# Patient Record
Sex: Female | Born: 1940 | Race: White | Hispanic: No | Marital: Married | Smoking: Never smoker
Health system: Southern US, Community
[De-identification: ages and names within clinical notes are randomized; demographics above are authoritative.]

## PROBLEM LIST (undated history)

## (undated) DIAGNOSIS — J869 Pyothorax without fistula: Secondary | ICD-10-CM

## (undated) DIAGNOSIS — Z9889 Other specified postprocedural states: Principal | ICD-10-CM

## (undated) DIAGNOSIS — J181 Lobar pneumonia, unspecified organism: Secondary | ICD-10-CM

## (undated) HISTORY — PX: CATARACT EXTRACTION: SUR2

## (undated) HISTORY — DX: Pyothorax without fistula: J86.9

## (undated) HISTORY — DX: Lobar pneumonia, unspecified organism: J18.1

## (undated) HISTORY — DX: Other specified postprocedural states: Z98.890

---

## 2001-08-26 ENCOUNTER — Other Ambulatory Visit: Admission: RE | Admit: 2001-08-26 | Discharge: 2001-08-26 | Payer: Self-pay | Admitting: Obstetrics and Gynecology

## 2002-01-20 ENCOUNTER — Encounter: Payer: Self-pay | Admitting: Obstetrics and Gynecology

## 2002-01-20 ENCOUNTER — Encounter: Admission: RE | Admit: 2002-01-20 | Discharge: 2002-01-20 | Payer: Self-pay | Admitting: Obstetrics and Gynecology

## 2018-10-02 ENCOUNTER — Emergency Department (HOSPITAL_COMMUNITY): Payer: Medicare Other

## 2018-10-02 ENCOUNTER — Inpatient Hospital Stay (HOSPITAL_COMMUNITY)
Admission: EM | Admit: 2018-10-02 | Discharge: 2018-10-08 | DRG: 853 | Disposition: A | Discharge status: Home Health | Payer: Medicare Other | Attending: Thoracic Surgery (Cardiothoracic Vascular Surgery) | Admitting: Thoracic Surgery (Cardiothoracic Vascular Surgery)

## 2018-10-02 ENCOUNTER — Encounter (HOSPITAL_COMMUNITY): Payer: Self-pay | Admitting: Emergency Medicine

## 2018-10-02 DIAGNOSIS — J189 Pneumonia, unspecified organism: Secondary | ICD-10-CM

## 2018-10-02 DIAGNOSIS — R0602 Shortness of breath: Secondary | ICD-10-CM

## 2018-10-02 DIAGNOSIS — Z79899 Other long term (current) drug therapy: Secondary | ICD-10-CM

## 2018-10-02 DIAGNOSIS — I5033 Acute on chronic diastolic (congestive) heart failure: Secondary | ICD-10-CM | POA: Diagnosis present

## 2018-10-02 DIAGNOSIS — D62 Acute posthemorrhagic anemia: Secondary | ICD-10-CM | POA: Diagnosis not present

## 2018-10-02 DIAGNOSIS — J181 Lobar pneumonia, unspecified organism: Secondary | ICD-10-CM | POA: Diagnosis present

## 2018-10-02 DIAGNOSIS — R911 Solitary pulmonary nodule: Secondary | ICD-10-CM | POA: Diagnosis present

## 2018-10-02 DIAGNOSIS — E876 Hypokalemia: Secondary | ICD-10-CM | POA: Diagnosis not present

## 2018-10-02 DIAGNOSIS — J869 Pyothorax without fistula: Secondary | ICD-10-CM | POA: Diagnosis present

## 2018-10-02 DIAGNOSIS — J90 Pleural effusion, not elsewhere classified: Secondary | ICD-10-CM | POA: Diagnosis present

## 2018-10-02 DIAGNOSIS — A419 Sepsis, unspecified organism: Secondary | ICD-10-CM | POA: Diagnosis not present

## 2018-10-02 DIAGNOSIS — J9621 Acute and chronic respiratory failure with hypoxia: Secondary | ICD-10-CM | POA: Diagnosis present

## 2018-10-02 DIAGNOSIS — Z9889 Other specified postprocedural states: Secondary | ICD-10-CM

## 2018-10-02 LAB — BASIC METABOLIC PANEL
Anion gap: 12 (ref 5–15)
BUN: 11 mg/dL (ref 8–23)
CO2: 23 mmol/L (ref 22–32)
Calcium: 9.1 mg/dL (ref 8.9–10.3)
Chloride: 105 mmol/L (ref 98–111)
Creatinine, Ser: 0.82 mg/dL (ref 0.44–1.00)
GFR calc Af Amer: >60 mL/min (ref >60)
GFR calc non Af Amer: >60 mL/min (ref >60)
Glucose, Bld: 175 mg/dL — ABNORMAL HIGH (ref 70–99)
Potassium: 3.7 mmol/L (ref 3.5–5.1)
Sodium: 140 mmol/L (ref 135–145)

## 2018-10-02 LAB — I-STAT TROPONIN, ED: Troponin i, poc: 0.03 ng/mL (ref 0.00–0.08)

## 2018-10-02 LAB — CBC
HCT: 34.9 % — ABNORMAL LOW (ref 36.0–46.0)
Hemoglobin: 11.2 g/dL — ABNORMAL LOW (ref 12.0–15.0)
MCH: 30.7 pg (ref 26.0–34.0)
MCHC: 32.1 g/dL (ref 30.0–36.0)
MCV: 95.6 fL (ref 80.0–100.0)
Platelets: 348 10*3/uL (ref 150–400)
RBC: 3.65 MIL/uL — ABNORMAL LOW (ref 3.87–5.11)
RDW: 14 % (ref 11.5–15.5)
WBC: 22.1 10*3/uL — ABNORMAL HIGH (ref 4.0–10.5)
nRBC: 0 % (ref 0.0–0.2)

## 2018-10-02 MED ORDER — HYDROMORPHONE HCL 1 MG/ML IJ SOLN
0.5000 mg | Freq: Once | INTRAMUSCULAR | Status: AC
  Administered 2018-10-02: 0.5 mg via INTRAVENOUS
  Filled 2018-10-02: qty 1

## 2018-10-02 MED ORDER — SODIUM CHLORIDE 0.9 % IV SOLN
2.0000 g | Freq: Once | INTRAVENOUS | Status: AC
  Administered 2018-10-02: 2 g via INTRAVENOUS
  Filled 2018-10-02: qty 20

## 2018-10-02 MED ORDER — KETOROLAC TROMETHAMINE 15 MG/ML IJ SOLN
15.0000 mg | Freq: Once | INTRAMUSCULAR | Status: AC
  Administered 2018-10-02: 15 mg via INTRAVENOUS
  Filled 2018-10-02: qty 1

## 2018-10-02 MED ORDER — ACETAMINOPHEN 325 MG PO TABS
650.0000 mg | ORAL_TABLET | Freq: Once | ORAL | Status: AC
  Administered 2018-10-02: 650 mg via ORAL
  Filled 2018-10-02: qty 2

## 2018-10-02 MED ORDER — SODIUM CHLORIDE 0.9 % IV SOLN
500.0000 mg | Freq: Once | INTRAVENOUS | Status: AC
  Administered 2018-10-02: 500 mg via INTRAVENOUS
  Filled 2018-10-02: qty 500

## 2018-10-02 MED ORDER — SODIUM CHLORIDE 0.9 % IV BOLUS
1000.0000 mL | Freq: Once | INTRAVENOUS | Status: AC
  Administered 2018-10-02: 1000 mL via INTRAVENOUS

## 2018-10-02 NOTE — ED Provider Notes (Signed)
[facility] EMERGENCY DEPARTMENT Provider Note   CSN: [id number] Arrival date & time: 10/02/18  1726     History   Chief Complaint Chief Complaint  Patient presents with  . Chest Pain    HPI Katie Schroeder is a 78 y.o. female.  HPI   78 year old female with chest pain and cough.  She reports chills this past weekend, congestion and cough.  Chills resolved and cough seemed to improve on Monday and Tuesday.  Worsening symptoms again yesterday.  Today she began developing very sharp left-sided chest pain with radiation to her back.  Worse with laying on her back, certain movements, deep breathing and coughing. No unusual leg pain or swelling.  Never smoked.  History reviewed. No pertinent past medical history.  There are no active problems to display for this patient.   Past Surgical History:  Procedure Laterality Date  . CESAREAN SECTION       OB History   No obstetric history on file.      Home Medications    Prior to Admission medications   Not on File    Family History History reviewed. No pertinent family history.  Social History Social History   Tobacco Use  . Smoking status: Never Smoker  . Smokeless tobacco: Never Used  Substance Use Topics  . Alcohol use: Not Currently  . Drug use: Never     Allergies   Patient has no known allergies.   Review of Systems Review of Systems  All systems reviewed and negative, other than as noted in HPI.  Physical Exam Updated Vital Signs BP (!) 145/62 (BP Location: Right Arm)   Pulse (!) 124   Temp 99.9 F (37.7 C) (Oral)   Resp 19   Ht 5\' 1"  (1.549 m)   Wt 64 kg   SpO2 100%   BMI 26.64 kg/m   Physical Exam Vitals signs and nursing note reviewed.  Constitutional:      General: She is not in acute distress.    Appearance: She is well-developed.  HENT:     Head: Normocephalic and atraumatic.  Eyes:     General:        Right eye: No discharge.        Left eye: No  discharge.     Conjunctiva/sclera: Conjunctivae normal.  Neck:     Musculoskeletal: Neck supple.  Cardiovascular:     Rate and Rhythm: Regular rhythm. Tachycardia present.     Heart sounds: Normal heart sounds. No murmur. No friction rub. No gallop.   Pulmonary:     Comments: Left-sided rhonchi Abdominal:     General: There is no distension.     Palpations: Abdomen is soft.     Tenderness: There is no abdominal tenderness.  Musculoskeletal:        General: No tenderness.     Comments: Lower extremities symmetric as compared to each other. No calf tenderness. Negative Homan's. No palpable cords.   Skin:    General: Skin is warm and dry.  Neurological:     Mental Status: She is alert.  Psychiatric:        Behavior: Behavior normal.        Thought Content: Thought content normal.      ED Treatments / Results  Labs (all labs ordered are listed, but only abnormal results are displayed) Labs Reviewed  BASIC METABOLIC PANEL - Abnormal; Notable for the following components:      Result Value   Glucose,  Bld 175 (*)    All other components within normal limits  CBC - Abnormal; Notable for the following components:   WBC 22.1 (*)    RBC 3.65 (*)    Hemoglobin 11.2 (*)    HCT 34.9 (*)    All other components within normal limits  I-STAT TROPONIN, ED    EKG None  Radiology Dg Chest 2 View  Result Date: 10/02/2018 CLINICAL DATA:  Left-sided chest pain EXAM: CHEST - 2 VIEW COMPARISON:  None. FINDINGS: Small moderate left pleural effusion, possibly slightly loculated along the lateral chest. Consolidation at the lingula and left lung base. Mild cardiomegaly. No pneumothorax. Degenerative changes of the spine. IMPRESSION: Small moderate left pleural effusion with airspace disease at the lingula and left lung base, possible pneumonia. Electronically Signed   By: Jasmine PangKim  Fujinaga M.D.   On: 10/02/2018 19:45    Procedures Procedures (including critical care time)  Medications Ordered  in ED Medications - No data to display   Initial Impression / Assessment and Plan / ED Course  I have reviewed the triage vital signs and the nursing notes.  Pertinent labs & imaging results that were available during my care of the patient were reviewed by me and considered in my medical decision making (see chart for details).     78 year old female with chest pain, cough and recent fevers.  Clinically pneumonia.  Significant leukocytosis.  Her area of pain corresponds abnormality on imaging.  She is otherwise healthy.  No known underlying medical illnesses.  She is on any chronic medications.  Aside from leukocytosis, her labs are reassuring.  She is not hypoxic.  Admission offered but she would prefer to go home. I think this is probably fine. She wa given a dose of Rocephin and azithromycin here in the emergency room.  Plan continued outpatient antibiotics.  12:53 AM Initial plan for discharge. While remaining in the ED to get dose of abx, she actually developed mild hypoxemia. RR remains pretty elevated. BP borderline. I think would be more prudent to admit.   Final Clinical Impressions(s) / ED Diagnoses   Final diagnoses:  Community acquired pneumonia of left lung, unspecified part of lung    ED Discharge Orders    None       Raeford RazorKohut, Defne Gerling, MD 10/03/18 [phone]

## 2018-10-02 NOTE — ED Notes (Signed)
Pt complained that she was feeling warm and was wondering if she was running a temperature. Temp was 99.9.

## 2018-10-02 NOTE — ED Triage Notes (Addendum)
Pt states she has had nasal congestion for a week, this is resolving. Pt developed left sided cp last night and continued into today. Pt went to primary MD and was sent here for further evaluation. Possible pna? Denies SOB, n/v.

## 2018-10-03 ENCOUNTER — Observation Stay (HOSPITAL_COMMUNITY): Payer: Medicare Other

## 2018-10-03 ENCOUNTER — Encounter (HOSPITAL_COMMUNITY): Payer: Self-pay | Admitting: Internal Medicine

## 2018-10-03 ENCOUNTER — Inpatient Hospital Stay (HOSPITAL_COMMUNITY): Payer: Medicare Other

## 2018-10-03 ENCOUNTER — Other Ambulatory Visit: Payer: Self-pay

## 2018-10-03 DIAGNOSIS — A419 Sepsis, unspecified organism: Secondary | ICD-10-CM | POA: Diagnosis present

## 2018-10-03 DIAGNOSIS — J9621 Acute and chronic respiratory failure with hypoxia: Secondary | ICD-10-CM | POA: Diagnosis present

## 2018-10-03 DIAGNOSIS — I5033 Acute on chronic diastolic (congestive) heart failure: Secondary | ICD-10-CM | POA: Diagnosis present

## 2018-10-03 DIAGNOSIS — J181 Lobar pneumonia, unspecified organism: Secondary | ICD-10-CM | POA: Diagnosis present

## 2018-10-03 DIAGNOSIS — I370 Nonrheumatic pulmonary valve stenosis: Secondary | ICD-10-CM | POA: Diagnosis not present

## 2018-10-03 DIAGNOSIS — E876 Hypokalemia: Secondary | ICD-10-CM | POA: Diagnosis not present

## 2018-10-03 DIAGNOSIS — D62 Acute posthemorrhagic anemia: Secondary | ICD-10-CM | POA: Diagnosis not present

## 2018-10-03 DIAGNOSIS — R911 Solitary pulmonary nodule: Secondary | ICD-10-CM | POA: Diagnosis present

## 2018-10-03 DIAGNOSIS — Z79899 Other long term (current) drug therapy: Secondary | ICD-10-CM | POA: Diagnosis not present

## 2018-10-03 DIAGNOSIS — I361 Nonrheumatic tricuspid (valve) insufficiency: Secondary | ICD-10-CM | POA: Diagnosis not present

## 2018-10-03 DIAGNOSIS — J90 Pleural effusion, not elsewhere classified: Secondary | ICD-10-CM | POA: Diagnosis present

## 2018-10-03 DIAGNOSIS — J869 Pyothorax without fistula: Secondary | ICD-10-CM | POA: Diagnosis present

## 2018-10-03 DIAGNOSIS — J189 Pneumonia, unspecified organism: Secondary | ICD-10-CM | POA: Diagnosis present

## 2018-10-03 HISTORY — DX: Lobar pneumonia, unspecified organism: J18.1

## 2018-10-03 LAB — CBC
HCT: 28.5 % — ABNORMAL LOW (ref 36.0–46.0)
HCT: 28.2 % — ABNORMAL LOW (ref 36.0–46.0)
Hemoglobin: 9.2 g/dL — ABNORMAL LOW (ref 12.0–15.0)
Hemoglobin: 9.4 g/dL — ABNORMAL LOW (ref 12.0–15.0)
MCH: 31 pg (ref 26.0–34.0)
MCH: 31.2 pg (ref 26.0–34.0)
MCHC: 32.3 g/dL (ref 30.0–36.0)
MCHC: 33.3 g/dL (ref 30.0–36.0)
MCV: 96 fL (ref 80.0–100.0)
MCV: 93.7 fL (ref 80.0–100.0)
NRBC: 0 % (ref 0.0–0.2)
Platelets: 288 10*3/uL (ref 150–400)
Platelets: 335 10*3/uL (ref 150–400)
RBC: 2.97 MIL/uL — ABNORMAL LOW (ref 3.87–5.11)
RBC: 3.01 MIL/uL — ABNORMAL LOW (ref 3.87–5.11)
RDW: 14.2 % (ref 11.5–15.5)
RDW: 14.2 % (ref 11.5–15.5)
WBC: 18.9 10*3/uL — AB (ref 4.0–10.5)
WBC: 21.4 10*3/uL — ABNORMAL HIGH (ref 4.0–10.5)
nRBC: 0 % (ref 0.0–0.2)

## 2018-10-03 LAB — BASIC METABOLIC PANEL
ANION GAP: 9 (ref 5–15)
BUN: 10 mg/dL (ref 8–23)
CO2: 21 mmol/L — ABNORMAL LOW (ref 22–32)
Calcium: 7.6 mg/dL — ABNORMAL LOW (ref 8.9–10.3)
Chloride: 111 mmol/L (ref 98–111)
Creatinine, Ser: 0.73 mg/dL (ref 0.44–1.00)
GFR calc Af Amer: >60 mL/min (ref >60)
GFR calc non Af Amer: >60 mL/min (ref >60)
Glucose, Bld: 115 mg/dL — ABNORMAL HIGH (ref 70–99)
Potassium: 3.5 mmol/L (ref 3.5–5.1)
SODIUM: 141 mmol/L (ref 135–145)

## 2018-10-03 LAB — ECHOCARDIOGRAM COMPLETE
HEIGHTINCHES: 61 in
Weight: 2340.8 oz

## 2018-10-03 LAB — PROTIME-INR
INR: 1.12
Prothrombin Time: 14.3 seconds (ref 11.4–15.2)

## 2018-10-03 LAB — SURGICAL PCR SCREEN
MRSA, PCR: NEGATIVE
Staphylococcus aureus: NEGATIVE

## 2018-10-03 LAB — HEPATIC FUNCTION PANEL
ALBUMIN: 2.5 g/dL — AB (ref 3.5–5.0)
ALT: 45 U/L — ABNORMAL HIGH (ref 0–44)
AST: 36 U/L (ref 15–41)
Alkaline Phosphatase: 128 U/L — ABNORMAL HIGH (ref 38–126)
Bilirubin, Direct: 0.2 mg/dL (ref 0.0–0.2)
Indirect Bilirubin: 0.5 mg/dL (ref 0.3–0.9)
Total Bilirubin: 0.7 mg/dL (ref 0.3–1.2)
Total Protein: 6.2 g/dL — ABNORMAL LOW (ref 6.5–8.1)

## 2018-10-03 LAB — COMPREHENSIVE METABOLIC PANEL
ALT: 41 U/L (ref 0–44)
AST: 29 U/L (ref 15–41)
Albumin: 2.3 g/dL — ABNORMAL LOW (ref 3.5–5.0)
Alkaline Phosphatase: 113 U/L (ref 38–126)
Anion gap: 10 (ref 5–15)
BUN: 10 mg/dL (ref 8–23)
CHLORIDE: 106 mmol/L (ref 98–111)
CO2: 24 mmol/L (ref 22–32)
CREATININE: 0.68 mg/dL (ref 0.44–1.00)
Calcium: 8.3 mg/dL — ABNORMAL LOW (ref 8.9–10.3)
GFR calc Af Amer: >60 mL/min (ref >60)
Glucose, Bld: 119 mg/dL — ABNORMAL HIGH (ref 70–99)
Potassium: 3.3 mmol/L — ABNORMAL LOW (ref 3.5–5.1)
Sodium: 140 mmol/L (ref 135–145)
Total Bilirubin: 0.8 mg/dL (ref 0.3–1.2)
Total Protein: 5.5 g/dL — ABNORMAL LOW (ref 6.5–8.1)

## 2018-10-03 LAB — TYPE AND SCREEN
ABO/RH(D): A NEG
Antibody Screen: NEGATIVE

## 2018-10-03 LAB — GLUCOSE, CAPILLARY: Glucose-Capillary: 112 mg/dL — ABNORMAL HIGH (ref 70–99)

## 2018-10-03 LAB — LACTIC ACID, PLASMA
Lactic Acid, Venous: 0.8 mmol/L (ref 0.5–1.9)
Lactic Acid, Venous: 0.9 mmol/L (ref 0.5–1.9)

## 2018-10-03 LAB — BRAIN NATRIURETIC PEPTIDE: B Natriuretic Peptide: 328.4 pg/mL — ABNORMAL HIGH (ref 0.0–100.0)

## 2018-10-03 LAB — APTT: aPTT: 41 seconds — ABNORMAL HIGH (ref 24–36)

## 2018-10-03 LAB — PROCALCITONIN: Procalcitonin: 0.48 ng/mL

## 2018-10-03 LAB — STREP PNEUMONIAE URINARY ANTIGEN: Strep Pneumo Urinary Antigen: NEGATIVE

## 2018-10-03 LAB — ABO/RH: ABO/RH(D): A NEG

## 2018-10-03 LAB — INFLUENZA PANEL BY PCR (TYPE A & B)
Influenza A By PCR: NEGATIVE
Influenza B By PCR: NEGATIVE

## 2018-10-03 LAB — LACTATE DEHYDROGENASE: LDH: 194 U/L — ABNORMAL HIGH (ref 98–192)

## 2018-10-03 MED ORDER — LEVALBUTEROL HCL 1.25 MG/0.5ML IN NEBU: 1.2500 mg | INHALATION_SOLUTION | Freq: Four times a day (QID) | RESPIRATORY_TRACT | Status: DC | PRN

## 2018-10-03 MED ORDER — IPRATROPIUM-ALBUTEROL 0.5-2.5 (3) MG/3ML IN SOLN
3.0000 mL | Freq: Four times a day (QID) | RESPIRATORY_TRACT | Status: DC
  Administered 2018-10-03: 3 mL via RESPIRATORY_TRACT
  Filled 2018-10-03: qty 3

## 2018-10-03 MED ORDER — SODIUM CHLORIDE 0.9 % IV BOLUS
1000.0000 mL | Freq: Once | INTRAVENOUS | Status: AC
  Administered 2018-10-03: 1000 mL via INTRAVENOUS

## 2018-10-03 MED ORDER — ZOLPIDEM TARTRATE 5 MG PO TABS: 5.0000 mg | ORAL_TABLET | Freq: Every evening | ORAL | Status: DC | PRN

## 2018-10-03 MED ORDER — ACETAMINOPHEN 325 MG PO TABS
650.0000 mg | ORAL_TABLET | Freq: Four times a day (QID) | ORAL | Status: DC | PRN
  Administered 2018-10-03: 650 mg via ORAL
  Filled 2018-10-03: qty 2

## 2018-10-03 MED ORDER — ENOXAPARIN SODIUM 40 MG/0.4ML ~~LOC~~ SOLN
40.0000 mg | SUBCUTANEOUS | Status: DC
  Administered 2018-10-03: 40 mg via SUBCUTANEOUS
  Filled 2018-10-03: qty 0.4

## 2018-10-03 MED ORDER — AMOXICILLIN 500 MG PO CAPS: 1000.0000 mg | ORAL_CAPSULE | Freq: Three times a day (TID) | ORAL | 0 refills | Status: DC

## 2018-10-03 MED ORDER — IOHEXOL 300 MG/ML  SOLN
75.0000 mL | Freq: Once | INTRAMUSCULAR | Status: AC | PRN
  Administered 2018-10-03: 75 mL via INTRAVENOUS

## 2018-10-03 MED ORDER — LEVALBUTEROL HCL 1.25 MG/0.5ML IN NEBU
INHALATION_SOLUTION | RESPIRATORY_TRACT | Status: AC
  Filled 2018-10-03: qty 0.5

## 2018-10-03 MED ORDER — SODIUM CHLORIDE 0.9 % IV SOLN
INTRAVENOUS | Status: DC
  Administered 2018-10-03 (×2): via INTRAVENOUS

## 2018-10-03 MED ORDER — GUAIFENESIN-CODEINE 100-10 MG/5ML PO SYRP: 5.0000 mL | ORAL_SOLUTION | Freq: Three times a day (TID) | ORAL | 0 refills | Status: DC | PRN

## 2018-10-03 MED ORDER — LEVALBUTEROL HCL 1.25 MG/0.5ML IN NEBU: 1.2500 mg | INHALATION_SOLUTION | Freq: Four times a day (QID) | RESPIRATORY_TRACT | Status: DC

## 2018-10-03 MED ORDER — POTASSIUM CHLORIDE CRYS ER 20 MEQ PO TBCR
40.0000 meq | EXTENDED_RELEASE_TABLET | Freq: Once | ORAL | Status: AC
  Administered 2018-10-03: 40 meq via ORAL
  Filled 2018-10-03: qty 2

## 2018-10-03 MED ORDER — LEVALBUTEROL HCL 1.25 MG/0.5ML IN NEBU
1.2500 mg | INHALATION_SOLUTION | Freq: Four times a day (QID) | RESPIRATORY_TRACT | Status: DC | PRN
  Administered 2018-10-03: 1.25 mg via RESPIRATORY_TRACT
  Filled 2018-10-03: qty 0.5

## 2018-10-03 MED ORDER — HYDROMORPHONE HCL 1 MG/ML IJ SOLN
0.5000 mg | Freq: Once | INTRAMUSCULAR | Status: AC
  Administered 2018-10-03: 0.5 mg via INTRAVENOUS
  Filled 2018-10-03: qty 1

## 2018-10-03 MED ORDER — MELOXICAM 7.5 MG PO TABS: 15.0000 mg | ORAL_TABLET | Freq: Every day | ORAL | 0 refills | Status: DC | PRN

## 2018-10-03 MED ORDER — VANCOMYCIN HCL IN DEXTROSE 1-5 GM/200ML-% IV SOLN
1000.0000 mg | INTRAVENOUS | Status: AC
  Administered 2018-10-04: 1000 mg via INTRAVENOUS
  Filled 2018-10-03 (×2): qty 200

## 2018-10-03 MED ORDER — AZITHROMYCIN 250 MG PO TABS: 250.0000 mg | ORAL_TABLET | Freq: Every day | ORAL | 0 refills | Status: DC

## 2018-10-03 MED ORDER — SODIUM CHLORIDE 0.9 % IV SOLN
1.0000 g | INTRAVENOUS | Status: DC
  Administered 2018-10-03 – 2018-10-05 (×3): 1 g via INTRAVENOUS
  Filled 2018-10-03 (×3): qty 10

## 2018-10-03 MED ORDER — SODIUM CHLORIDE 0.9 % IV SOLN
500.0000 mg | INTRAVENOUS | Status: DC
  Administered 2018-10-04 (×2): 500 mg via INTRAVENOUS
  Filled 2018-10-03 (×2): qty 500

## 2018-10-03 MED ORDER — FUROSEMIDE 10 MG/ML IJ SOLN
60.0000 mg | Freq: Once | INTRAMUSCULAR | Status: AC
  Administered 2018-10-03: 60 mg via INTRAVENOUS
  Filled 2018-10-03: qty 6

## 2018-10-03 MED ORDER — ONDANSETRON HCL 4 MG/2ML IJ SOLN: 4.0000 mg | Freq: Three times a day (TID) | INTRAMUSCULAR | Status: DC | PRN

## 2018-10-03 MED ORDER — DM-GUAIFENESIN ER 30-600 MG PO TB12: 1.0000 | ORAL_TABLET | Freq: Two times a day (BID) | ORAL | Status: DC | PRN

## 2018-10-03 MED ORDER — SODIUM CHLORIDE 0.9 % IV SOLN: 500.0000 mg | INTRAVENOUS | Status: DC

## 2018-10-03 NOTE — Progress Notes (Signed)
PROGRESS NOTE                                                                                                                                                                                                             Patient Demographics:    Katie Schroeder, is a 78 y.o. female, DOB - 1941/01/06, RVI:[id number]  Admit date - 10/02/2018   Admitting Physician Lorretta Harp, MD  Outpatient Primary MD for the patient is Patient, No Pcp Per  LOS - 0  Chief Complaint  Patient presents with  . Chest Pain       Brief Narrative [name] is a 78 y.o. female without significant past medical history, not currently taking any medications, who presents with cough, chest pain, fever and chills, he was admitted with  Acute hypoxic respiratory failure, sepsis, pneumonia and loculated left-sided pleural effusion.   Subjective:    Katie Schroeder today has, No headache, No chest pain, No abdominal pain - No Nausea, No new weakness tingling or numbness, mild Cough with ++ SOB.     Assessment  & Plan :      1.  Acute hypoxic respiratory failure, sepsis, pneumonia and loculated left-sided pleural effusion.  Continue antibiotics, follow cultures, sepsis pathophysiology seems to have improved, discussed CT findings with pulmonary on call Dr. Daleen Bo who recommended to tap the fluid for testing and to consult cardiothoracic surgery as patient may require decortication.  Cardiothoracic surgery has been consulted and IR has been consulted for the tap as well.  Appropriate tests have been ordered.  Continue supportive care with oxygen and nebulizer treatments for now.  2.  Acute on chronic diastolic CHF.  Diffuse Rales bilaterally.  Hold further IV fluids, Lasix IV, get echocardiogram and BNP.  Requested to sit up in chair as much as possible.      Family Communication  :  none  Code Status :  Full  Disposition Plan  :  Tele  Consults  :  IR, cardiothoracic surgery, pulmonary over the  phone  Procedures  :    TTE -   CT  -  Consolidation in the left lower lobe as well as loculated left pleural effusion which appears worsened when compared with the prior plain film of 10/02/2018. Somewhat spiculated lesion in the right upper lobe measuring 2 cm in greatest dimension. Although this may be postinflammatory in nature and related to the changes on the left side, follow-up CT examination in 3 months is recommended. Aortic  Atherosclerosis (ICD10-I70.0).  DVT Prophylaxis  :  Lovenox   Lab Results  Component Value Date   PLT 288 10/03/2018    Diet :  Diet Order            Diet regular Room service appropriate? Yes; Fluid consistency: Thin  Diet effective now               Inpatient Medications Scheduled Meds: . enoxaparin (LOVENOX) injection  40 mg Subcutaneous Q24H   Continuous Infusions: . azithromycin    . cefTRIAXone (ROCEPHIN)  IV     PRN Meds:.acetaminophen, dextromethorphan-guaiFENesin, levalbuterol, ondansetron (ZOFRAN) IV, zolpidem  Antibiotics  :   Anti-infectives (From admission, onward)   Start     Dose/Rate Route Frequency Ordered Stop   10/04/18 0000  azithromycin (ZITHROMAX) 500 mg in sodium chloride 0.9 % 250 mL IVPB  Status:  Discontinued     500 mg 250 mL/hr over 60 Minutes Intravenous Every 24 hours 10/03/18 0109 10/03/18 0425   10/03/18 2200  cefTRIAXone (ROCEPHIN) 1 g in sodium chloride 0.9 % 100 mL IVPB     1 g 200 mL/hr over 30 Minutes Intravenous Every 24 hours 10/03/18 0109 10/10/18 2159   10/03/18 2200  azithromycin (ZITHROMAX) 500 mg in sodium chloride 0.9 % 250 mL IVPB     500 mg 250 mL/hr over 60 Minutes Intravenous Every 24 hours 10/03/18 0425 10/10/18 2159   10/03/18 0000  amoxicillin (AMOXIL) 500 MG capsule     1,000 mg Oral 3 times daily 10/03/18 0025     10/03/18 0000  azithromycin (ZITHROMAX) 250 MG tablet     250 mg Oral Daily 10/03/18 0025     10/02/18 2230  cefTRIAXone (ROCEPHIN) 2 g in sodium chloride 0.9 % 100 mL  IVPB     2 g 200 mL/hr over 30 Minutes Intravenous  Once 10/02/18 2221 10/02/18 2339   10/02/18 2230  azithromycin (ZITHROMAX) 500 mg in sodium chloride 0.9 % 250 mL IVPB     500 mg 250 mL/hr over 60 Minutes Intravenous  Once 10/02/18 2221 10/03/18 0045          Objective:   Vitals:   10/03/18 0531 10/03/18 0920 10/03/18 0923 10/03/18 0935  BP: (!) 107/55   132/66  Pulse: 97 (!) 137 (!) 106 100  Resp: 18   18  Temp: 97.7 F (36.5 C)   99.1 F (37.3 C)  TempSrc: Oral   Oral  SpO2: 96% (!) 79% 93% 96%  Weight:      Height:        Wt Readings from Last 3 Encounters:  10/03/18 66.4 kg     Intake/Output Summary (Last 24 hours) at 10/03/2018 1209 Last data filed at 10/03/2018 1203 Gross per 24 hour  Intake 5898.24 ml  Output 1001 ml  Net 4897.24 ml     Physical Exam  Awake Alert, Oriented X 3, No new F.N deficits, Normal affect Pratt.AT,PERRAL Supple Neck,No JVD, No cervical lymphadenopathy appriciated.  Symmetrical Chest wall movement, Good air movement bilaterally, diffuse rales RRR,No Gallops,Rubs or new Murmurs, No Parasternal Heave +ve B.Sounds, Abd Soft, No tenderness, No organomegaly appriciated, No rebound - guarding or rigidity. No Cyanosis, Clubbing or edema, No new Rash or bruise      Data Review:    CBC Recent Labs  Lab 10/02/18 1746 10/03/18 0735  WBC 22.1* 18.9*  HGB 11.2* 9.2*  HCT 34.9* 28.5*  PLT 348 288  MCV 95.6 96.0  MCH 30.7 31.0  MCHC 32.1 32.3  RDW 14.0 14.2    Chemistries  Recent Labs  Lab 10/02/18 1746 10/03/18 0735  NA 140 141  K 3.7 3.5  CL 105 111  CO2 23 21*  GLUCOSE 175* 115*  BUN 11 10  CREATININE 0.82 0.73  CALCIUM 9.1 7.6*   ------------------------------------------------------------------------------------------------------------------ No results for input(s): CHOL, HDL, LDLCALC, TRIG, CHOLHDL, LDLDIRECT in the last 72 hours.  No results found for:  HGBA1C ------------------------------------------------------------------------------------------------------------------ No results for input(s): TSH, T4TOTAL, T3FREE, THYROIDAB in the last 72 hours.  Invalid input(s): FREET3 ------------------------------------------------------------------------------------------------------------------ No results for input(s): VITAMINB12, FOLATE, FERRITIN, TIBC, IRON, RETICCTPCT in the last 72 hours.  Coagulation profile No results for input(s): INR, PROTIME in the last 168 hours.  No results for input(s): DDIMER in the last 72 hours.  Cardiac Enzymes No results for input(s): CKMB, TROPONINI, MYOGLOBIN in the last 168 hours.  Invalid input(s): CK ------------------------------------------------------------------------------------------------------------------ No results found for: BNP  Micro Results No results found for this or any previous visit (from the past 240 hour(s)).  Radiology Reports Dg Chest 2 View  Result Date: 10/02/2018 CLINICAL DATA:  Left-sided chest pain EXAM: CHEST - 2 VIEW COMPARISON:  None. FINDINGS: Small moderate left pleural effusion, possibly slightly loculated along the lateral chest. Consolidation at the lingula and left lung base. Mild cardiomegaly. No pneumothorax. Degenerative changes of the spine. IMPRESSION: Small moderate left pleural effusion with airspace disease at the lingula and left lung base, possible pneumonia. Electronically Signed   By: Jasmine PangKim  Fujinaga M.D.   On: 10/02/2018 19:45   Ct Chest W Contrast  Result Date: 10/03/2018 CLINICAL DATA:  Increasing shortness of breath EXAM: CT CHEST WITH CONTRAST TECHNIQUE: Multidetector CT imaging of the chest was performed during intravenous contrast administration. CONTRAST:  75mL OMNIPAQUE IOHEXOL 300 MG/ML  SOLN COMPARISON:  Plain film from earlier in the same day. FINDINGS: Cardiovascular: Thoracic aorta demonstrates atherosclerotic calcifications without aneurysmal  dilatation or dissection. Heart is at the upper limits of normal in size. Coronary calcifications are noted. No large central pulmonary embolus is seen. Peripheral branch evaluation is limited. Mediastinum/Nodes: Thoracic inlet is within normal limits. Scattered small mediastinal lymph nodes are noted likely reactive in nature. No sizable adenopathy is seen. Lungs/Pleura: Small right pleural effusion is noted. More sizable left pleural effusion is noted with areas of loculation along the chest wall particularly laterally and near the apex. Right lung is well aerated. A somewhat spiculated density is noted in the right upper lobe best seen on image number 57 of series 7. This measures approximately 2 cm in greatest dimension consolidation is noted within the left lower lobe. No sizable nodule on the left is seen. Upper Abdomen: No acute abnormality. Musculoskeletal: Degenerative changes of the thoracic spine are noted. IMPRESSION: Consolidation in the left lower lobe as well as loculated left pleural effusion which appears worsened when compared with the prior plain film of 10/02/2018. Somewhat spiculated lesion in the right upper lobe measuring 2 cm in greatest dimension. Although this may be postinflammatory in nature and related to the changes on the left side, follow-up CT examination in 3 months is recommended. Aortic Atherosclerosis (ICD10-I70.0). Electronically Signed   By: Alcide CleverMark  Lukens M.D.   On: 10/03/2018 11:08   Dg Chest Port 1 View  Result Date: 10/03/2018 CLINICAL DATA:  Short of breath EXAM: PORTABLE CHEST 1 VIEW COMPARISON:  10/02/2018 FINDINGS: The heart remains moderately enlarged. Left pleural effusion which is likely partially loculated has increased. Opacity at the left base has  also increased likely combination of pleural fluid and consolidation. Small right pleural effusion has developed. No pneumothorax. IMPRESSION: Increasing left pleural effusion which appears partially loculated.  Increasing opacity at the left base. New small right pleural effusion. Electronically Signed   By: Jolaine ClickArthur  Hoss M.D.   On: 10/03/2018 09:15    Time Spent in minutes  30   Susa RaringPrashant Wael Maestas M.D on 10/03/2018 at 12:09 PM  To page go to [email or web address] - password [email or web address]

## 2018-10-03 NOTE — Progress Notes (Signed)
  Echocardiogram 2D Echocardiogram has been performed.  Janalyn Harder 10/03/2018, 11:56 AM

## 2018-10-03 NOTE — H&P (View-Only) (Signed)
Reason for Consult:Left parapneumonic effusion Referring Physician: [facility]  Katie Schroeder is an 78 y.o. female.  HPI: 78 yo woman with no significant past medical history presented with cc/o left sided pleuritic chest pain. Was in her usual state of health until about a week and a half ago when she "came down with a cold." Mostly URI symptoms of nasal congestion, malaise and low grade fever initially. But 2 days ago developed left sided chest pain with cough and deep breathing. Cough has been nonproductive. Pain left chest, radiates to back. Constant now but aggravated by coughing and talking. She went to her primary who sent her on to the ED yesterday. A CXR showed a possible pneumonia with a small to moderate effusion. Her WBC was 22 K. Had a low grade temp of 99.9.  She was admitted and started on IV antibiotics with azithromycin and ceftriaxone.   A CT chest showed left lower lobe airspace disease with a complex loculated pleural effusion.  Her symptoms have improved since admission although still present.  History reviewed. No pertinent past medical history.  Past Surgical History:  Procedure Laterality Date  . CATARACT EXTRACTION    . CESAREAN SECTION      Family History  Problem Relation Age of Onset  . Dementia Mother   . CAD Father     Social History:  reports that she has never smoked. She has never used smokeless tobacco. She reports previous alcohol use. She reports that she does not use drugs.  Allergies: No Known Allergies  Medications:  Scheduled: . enoxaparin (LOVENOX) injection  40 mg Subcutaneous Q24H    Results for orders placed or performed during the hospital encounter of 10/02/18 (from the past 48 hour(s))  Basic metabolic panel     Status: Abnormal   Collection Time: 10/02/18  5:46 PM  Result Value Ref Range   Sodium 140 135 - 145 mmol/L   Potassium 3.7 3.5 - 5.1 mmol/L   Chloride 105 98 - 111 mmol/L   CO2 23 22 - 32 mmol/L   Glucose, Bld 175 (H) 70  - 99 mg/dL   BUN 11 8 - 23 mg/dL   Creatinine, Ser 5.78 0.44 - 1.00 mg/dL   Calcium 9.1 8.9 - 46.9 mg/dL   GFR calc non Af Amer >60 >60 mL/min   GFR calc Af Amer >60 >60 mL/min   Anion gap 12 5 - 15    Comment: Performed at [facility], [address], [place], [place] [address]  CBC     Status: Abnormal   Collection Time: 10/02/18  5:46 PM  Result Value Ref Range   WBC 22.1 (H) 4.0 - 10.5 K/uL   RBC 3.65 (L) 3.87 - 5.11 MIL/uL   Hemoglobin 11.2 (L) 12.0 - 15.0 g/dL   HCT 84.1 (L) 32.4 - 40.1 %   MCV 95.6 80.0 - 100.0 fL   MCH 30.7 26.0 - 34.0 pg   MCHC 32.1 30.0 - 36.0 g/dL   RDW 02.7 25.3 - 66.4 %   Platelets 348 150 - 400 K/uL   nRBC 0.0 0.0 - 0.2 %    Comment: Performed at [facility], [address], [place], [place] [address]  I-stat troponin, ED     Status: None   Collection Time: 10/02/18  5:56 PM  Result Value Ref Range   Troponin i, poc 0.03 0.00 - 0.08 ng/mL   Comment 3  Comment: Due to the release kinetics of cTnI, a negative result within the first hours of the onset of symptoms does not rule out myocardial infarction with certainty. If myocardial infarction is still suspected, repeat the test at appropriate intervals.   Lactic acid, plasma     Status: None   Collection Time: 10/03/18  1:48 AM  Result Value Ref Range   Lactic Acid, Venous 0.8 0.5 - 1.9 mmol/L    Comment: Performed at [facility], [address], [place], [place] [address]  Procalcitonin     Status: None   Collection Time: 10/03/18  1:48 AM  Result Value Ref Range   Procalcitonin 0.48 ng/mL    Comment:        Interpretation: PCT (Procalcitonin) <= 0.5 ng/mL: Systemic infection (sepsis) is not likely. Local bacterial infection is possible. (NOTE)       Sepsis PCT Algorithm           Lower Respiratory Tract                                      Infection PCT Algorithm    ----------------------------     ----------------------------         PCT < 0.25 ng/mL                 PCT < 0.10 ng/mL         Strongly encourage             Strongly discourage   discontinuation of antibiotics    initiation of antibiotics    ----------------------------     -----------------------------       PCT 0.25 - 0.50 ng/mL            PCT 0.10 - 0.25 ng/mL               OR       >80% decrease in PCT            Discourage initiation of                                            antibiotics      Encourage discontinuation           of antibiotics    ----------------------------     -----------------------------         PCT >= 0.50 ng/mL              PCT 0.26 - 0.50 ng/mL               AND        <80% decrease in PCT             Encourage initiation of                                             antibiotics       Encourage continuation           of antibiotics    ----------------------------     -----------------------------        PCT >= 0.50 ng/mL  PCT > 0.50 ng/mL               AND         increase in PCT                  Strongly encourage                                      initiation of antibiotics    Strongly encourage escalation           of antibiotics                                     -----------------------------                                           PCT <= 0.25 ng/mL                                                 OR                                        > 80% decrease in PCT                                     Discontinue / Do not initiate                                             antibiotics Performed at [facility], [address] [place], [place] [address]   Glucose, capillary     Status: Abnormal   Collection Time: 10/03/18  2:45 AM  Result Value Ref Range   Glucose-Capillary 112 (H) 70 - 99 mg/dL  Influenza panel by PCR (type A & B)     Status: None   Collection Time: 10/03/18  3:23 AM  Result Value Ref Range   Influenza A By PCR NEGATIVE NEGATIVE   Influenza B By PCR NEGATIVE NEGATIVE    Comment:  (NOTE) The Xpert Xpress Flu assay is intended as an aid in the diagnosis of  influenza and should not be used as a sole basis for treatment.  This  assay is FDA approved for nasopharyngeal swab specimens only. Nasal  washings and aspirates are unacceptable for Xpert Xpress Flu testing. Performed at [facility], [address] [place], [place] [address]   Lactic acid, plasma     Status: None   Collection Time: 10/03/18  6:36 AM  Result Value Ref Range   Lactic Acid, Venous 0.9 0.5 - 1.9 mmol/L    Comment: Performed at [facility], [address] [place], [place] [address]  CBC     Status: Abnormal   Collection Time: 10/03/18  7:35 AM  Result Value Ref Range  WBC 18.9 (H) 4.0 - 10.5 K/uL   RBC 2.97 (L) 3.87 - 5.11 MIL/uL   Hemoglobin 9.2 (L) 12.0 - 15.0 g/dL   HCT 56.328.5 (L) 87.536.0 - 64.346.0 %   MCV 96.0 80.0 - 100.0 fL   MCH 31.0 26.0 - 34.0 pg   MCHC 32.3 30.0 - 36.0 g/dL   RDW 32.914.2 51.811.5 - 84.115.5 %   Platelets 288 150 - 400 K/uL   nRBC 0.0 0.0 - 0.2 %    Comment: Performed at [facility], [address] [place], [place] [address]  Basic metabolic panel     Status: Abnormal   Collection Time: 10/03/18  7:35 AM  Result Value Ref Range   Sodium 141 135 - 145 mmol/L   Potassium 3.5 3.5 - 5.1 mmol/L   Chloride 111 98 - 111 mmol/L   CO2 21 (L) 22 - 32 mmol/L   Glucose, Bld 115 (H) 70 - 99 mg/dL   BUN 10 8 - 23 mg/dL   Creatinine, Ser 0.160.73 0.44 - 1.00 mg/dL   Calcium 7.6 (L) 8.9 - 10.3 mg/dL   GFR calc non Af Amer >60 >60 mL/min   GFR calc Af Amer >60 >60 mL/min   Anion gap 9 5 - 15    Comment: Performed at [facility], [address] [place], [place] [address]  Strep pneumoniae urinary antigen     Status: None   Collection Time: 10/03/18  8:55 AM  Result Value Ref Range   Strep Pneumo Urinary Antigen NEGATIVE NEGATIVE    Comment:        Infection due to S. pneumoniae cannot be absolutely ruled out since the antigen present may be below the detection  limit of the test. Performed at [facility], [address] [place], [place] [address]   Lactate dehydrogenase     Status: Abnormal   Collection Time: 10/03/18  1:27 PM  Result Value Ref Range   LDH 194 (H) 98 - 192 U/L    Comment: Performed at [facility], [address] [place], [place] [address]  Hepatic function panel     Status: Abnormal   Collection Time: 10/03/18  1:27 PM  Result Value Ref Range   Total Protein 6.2 (L) 6.5 - 8.1 g/dL   Albumin 2.5 (L) 3.5 - 5.0 g/dL   AST 36 15 - 41 U/L   ALT 45 (H) 0 - 44 U/L   Alkaline Phosphatase 128 (H) 38 - 126 U/L   Total Bilirubin 0.7 0.3 - 1.2 mg/dL   Bilirubin, Direct 0.2 0.0 - 0.2 mg/dL   Indirect Bilirubin 0.5 0.3 - 0.9 mg/dL    Comment: Performed at [facility], [address] [place], [place] [address]    Dg Chest 2 View  Result Date: 10/02/2018 CLINICAL DATA:  Left-sided chest pain EXAM: CHEST - 2 VIEW COMPARISON:  None. FINDINGS: Small moderate left pleural effusion, possibly slightly loculated along the lateral chest. Consolidation at the lingula and left lung base. Mild cardiomegaly. No pneumothorax. Degenerative changes of the spine. IMPRESSION: Small moderate left pleural effusion with airspace disease at the lingula and left lung base, possible pneumonia. Electronically Signed   By: Jasmine PangKim  Fujinaga M.D.   On: 10/02/2018 19:45   Ct Chest W Contrast  Result Date: 10/03/2018 CLINICAL DATA:  Increasing shortness of breath EXAM: CT CHEST WITH CONTRAST TECHNIQUE: Multidetector CT imaging of the chest was performed during intravenous contrast administration. CONTRAST:  75mL OMNIPAQUE IOHEXOL 300 MG/ML  SOLN COMPARISON:  Plain film from earlier in the same day. FINDINGS: Cardiovascular: Thoracic aorta demonstrates atherosclerotic calcifications without aneurysmal dilatation or dissection. Heart is at the upper limits of normal in size. Coronary calcifications are noted. No large central pulmonary embolus is seen.  Peripheral branch evaluation is limited. Mediastinum/Nodes: Thoracic inlet is within normal limits. Scattered small mediastinal lymph nodes are noted likely reactive in nature. No sizable adenopathy is seen. Lungs/Pleura: Small right pleural effusion is noted. More sizable left pleural effusion is noted with areas of loculation along the chest wall particularly laterally and near the apex. Right lung is well aerated. A somewhat spiculated density is noted in the right upper lobe best seen on image number 57 of series 7. This measures approximately 2 cm in greatest dimension consolidation is noted within the left lower lobe. No sizable nodule on the left is seen. Upper Abdomen: No acute abnormality. Musculoskeletal: Degenerative changes of the thoracic spine are noted. IMPRESSION: Consolidation in the left lower lobe as well as loculated left pleural effusion which appears worsened when compared with the prior plain film of 10/02/2018. Somewhat spiculated lesion in the right upper lobe measuring 2 cm in greatest dimension. Although this may be postinflammatory in nature and related to the changes on the left side, follow-up CT examination in 3 months is recommended. Aortic Atherosclerosis (ICD10-I70.0). Electronically Signed   By: Alcide Clever M.D.   On: 10/03/2018 11:08   Dg Chest Port 1 View  Result Date: 10/03/2018 CLINICAL DATA:  Short of breath EXAM: PORTABLE CHEST 1 VIEW COMPARISON:  10/02/2018 FINDINGS: The heart remains moderately enlarged. Left pleural effusion which is likely partially loculated has increased. Opacity at the left base has also increased likely combination of pleural fluid and consolidation. Small right pleural effusion has developed. No pneumothorax. IMPRESSION: Increasing left pleural effusion which appears partially loculated. Increasing opacity at the left base. New small right pleural effusion. Electronically Signed   By: Jolaine Click M.D.   On: 10/03/2018 09:15   I personally  reviewed the CT and CXR images and concur with the findings noted above  Review of Systems  Constitutional: Positive for fever and malaise/fatigue. Negative for chills.  HENT: Positive for congestion.   Respiratory: Positive for cough and shortness of breath.   Cardiovascular: Positive for chest pain (pleuritic left sided).  Gastrointestinal: Negative for nausea and vomiting.  Genitourinary: Negative for dysuria and frequency.  Musculoskeletal: Positive for back pain. Negative for joint pain.  Neurological: Negative for focal weakness and loss of consciousness.  All other systems reviewed and are negative.  Blood pressure 132/66, pulse 98, temperature 99.1 F (37.3 C), temperature source Oral, resp. rate 18, height 5\' 1"  (1.549 m), weight 66.4 kg, SpO2 98 %. Physical Exam  Vitals reviewed. Constitutional: She is oriented to person, place, and time. She appears well-developed and well-nourished. No distress.  HENT:  Head: Normocephalic and atraumatic.  Mouth/Throat: No oropharyngeal exudate.  Eyes: Conjunctivae and EOM are normal. No scleral icterus.  Neck: No thyromegaly present.  Cardiovascular: Normal rate, normal heart sounds and intact distal pulses. Exam reveals no gallop and no friction rub.  No murmur heard. Respiratory: Effort normal. No respiratory distress. She has no wheezes.      Diminished BS left lower lung field  GI: She exhibits no distension. There is no abdominal tenderness.  Musculoskeletal:        General: No deformity or edema.  Lymphadenopathy:    She has  no cervical adenopathy.  Neurological: She is alert and oriented to person, place, and time. No cranial nerve deficit. She exhibits normal muscle tone. Coordination normal.  Skin: Skin is warm and dry.    Assessment/Plan: 78 yo woman with no significant past history presents with fever, cough, and left sided pleuritic chest pain. Febrile with elevated WBC. CXR and CT show left lower pneumonia with a  loculated parapneumonic effusion. She needs the effusion drained to allow pneumonia to be treated and preserve lung function.  I recommended we proceed with left VATS for drainage of effusion and possible decortication.   I discussed the general nature of the procedure, the need for general anesthesia, the incisions to be used and the need for drainage tubes postoperatively with Mrs. Demuro. We discussed the expected hospital stay, overall recovery and short and long term outcomes. I informed her of the indications, risks, benefits and alternatives. She understands the risks include, but are not limited to death, stroke, MI, DVT/PE, bleeding, possible need for transfusion, infections, prolonged air leaks, cardiac arrhythmias, as well as other organ system dysfunction including respiratory, renal, or GI complications.   She accepts the risks and agrees to proceed.  Plan OR tomorrow AM  Loreli Slot 10/03/2018, 2:40 PM

## 2018-10-03 NOTE — Evaluation (Signed)
Physical Therapy Evaluation Patient Details Name: Katie Schroeder MRN: [id number] DOB: [birth year] Today's Date: 10/03/2018   History of Present Illness  Katie Schroeder is a 78yo female who comes to [facility] on 1/9 c cough, CP, imaging revealing of Left lung infiltrates, admitted with PNA. PTA pt was a fully indep community dwelling adult withou assistive device use.   Clinical Impression  Pt admitted with above diagnosis. Pt currently with functional limitations due to the deficits listed below (see "PT Problem List"). Upon entry, pt in bed, no family/caregiver present. The pt is awake and agreeable to participate, satting at 96% on room air. The pt is alert and oriented x3, pleasant, conversational, and following simple commands consistently. AMB on room air to 126ft with terminal HR ranging 127-135 bpm c CP and scapular back pain, SpO2 drops to 79%. The patient is moved to 3L/min for fast/comfortable recovery, then left on room air as received at 93%. Pt reports feeling very weak and fatigued after this short distance AMB. Functional mobility assessment demonstrates increased effort/time requirements, poor tolerance, but no frank need for physical assistance, whereas the patient performed these at a higher level of independence PTA. Pt will benefit from skilled PT intervention to increase independence and safety with basic mobility in preparation for discharge to the venue listed below.       Follow Up Recommendations Home health PT;Supervision for mobility/OOB    Equipment Recommendations  None recommended by PT    Recommendations for Other Services       Precautions / Restrictions Precautions Precautions: None Restrictions Weight Bearing Restrictions: No      Mobility  Bed Mobility               General bed mobility comments: received in chair  Transfers Overall transfer level: Modified independent                  Ambulation/Gait Ambulation/Gait assistance:  Supervision Gait Distance (Feet): 120 Feet Assistive device: None(railing in hallway intermittently ) Gait Pattern/deviations: (slow, appears very weak;)     General Gait Details: HR quickly rises into mid 120s, then terminal 135bpm. Concurrent CP and scapular back pain when HR gets this high. Terminal SpO2: 79% on Room air.   Stairs            Wheelchair Mobility    Modified Rankin (Stroke Patients Only)       Balance Overall balance assessment: Modified Independent                                           Pertinent Vitals/Pain Pain Assessment: Faces Faces Pain Scale: Hurts little more Pain Location: CP during AMB c tachycardia Pain Intervention(s): Limited activity within patient's tolerance;Monitored during session;Repositioned    Home Living Family/patient expects to be discharged to:: Private residence Living Arrangements: Spouse/significant other(adult daughter  lives in works FT ) Available Help at Discharge: Family;Available 24 hours/day Type of Home: House Home Access: Stairs to enter Entrance Stairs-Rails: Right Entrance Stairs-Number of Steps: 3 partial steps  Home Layout: Two level Home Equipment: Walker - 2 wheels;Cane - single point;Bedside commode      Prior Function Level of Independence: Independent         Comments: full yindependent adult; Part time work as Dispensing optician   Dominant Hand: Right    Extremity/Trunk Assessment  Upper Extremity Assessment Upper Extremity Assessment: Generalized weakness;Overall [facility]FL for tasks assessed    Lower Extremity Assessment Lower Extremity Assessment: Generalized weakness;Overall WFL for tasks assessed       Communication   Communication: No difficulties  Cognition Arousal/Alertness: Awake/alert Behavior During Therapy: WFL for tasks assessed/performed Overall Cognitive Status: Within Functional Limits for tasks assessed                                         General Comments      Exercises     Assessment/Plan    PT Assessment Patient needs continued PT services  PT Problem List Decreased strength;Decreased activity tolerance;Decreased balance;Decreased mobility;Cardiopulmonary status limiting activity       PT Treatment Interventions DME instruction;Therapeutic exercise;Gait training;Stair training;Functional mobility training;Therapeutic activities;Balance training;Patient/family education    PT Goals (Current goals can be found in the Care Plan section)  Acute Rehab PT Goals Patient Stated Goal: regain strength and stamina PT Goal Formulation: With patient Time For Goal Achievement: 10/10/18 Potential to Achieve Goals: Good    Frequency Min 3X/week   Barriers to discharge        Co-evaluation               AM-PAC PT "6 Clicks" Mobility  Outcome Measure Help needed turning from your back to your side while in a flat bed without using bedrails?: None Help needed moving from lying on your back to sitting on the side of a flat bed without using bedrails?: None Help needed moving to and from a bed to a chair (including a wheelchair)?: None Help needed standing up from a chair using your arms (e.g., wheelchair or bedside chair)?: None Help needed to walk in hospital room?: A Little Help needed climbing 3-5 steps with a railing? : A Little 6 Click Score: 22    End of Session Equipment Utilized During Treatment: Gait belt;Oxygen Activity Tolerance: Patient limited by fatigue;Treatment limited secondary to medical complications (Comment);Patient limited by pain Patient left: in chair;with call bell/phone within reach Nurse Communication: Mobility status PT Visit Diagnosis: Difficulty in walking, not elsewhere classified (R26.2);Other abnormalities of gait and mobility (R26.89)    Time: [id number] PT Time Calculation (min) (ACUTE ONLY): 18 min   Charges:   PT Evaluation $PT Eval Low Complexity:  1 Low         11:39 AM, 10/03/18 Rosamaria LintsAllan C Tina Gruner, PT, DPT Physical Therapist - [place] [phone] (Pager)  [phone] (Office)      Rayn Shorb C 10/03/2018, 11:32 AM

## 2018-10-03 NOTE — Progress Notes (Signed)
Attempted to get report x1, RN advised he will call back.

## 2018-10-03 NOTE — Progress Notes (Signed)
Report received from RN. Patient arrived to unit via stretcher. A/O x 4. Placed on telemetry, continuous pulse ox, and 2 L nasal canula. Will continue to monitor.

## 2018-10-03 NOTE — Discharge Instructions (Addendum)
Discharge Instructions: ° °1. You may shower, please wash incisions daily with soap and water and keep dry.  If you wish to cover wounds with dressing you may do so but please keep clean and change daily.  No tub baths or swimming until incisions have completely healed.  If your incisions become red or develop any drainage please call our office at [phone] ° °2. No Driving until cleared by Dr. Hendrickson's office and you are no longer using narcotic pain medications ° °3. Fever of 101.5 for at least 24 hours with no source, please contact our office at [phone] ° °4. Activity- up as tolerated, please walk at least 3 times per day.  Avoid strenuous activity ° °5. If any questions or concerns arise, please do not hesitate to contact our office at [phone] ° ° ° °

## 2018-10-03 NOTE — CV Procedure (Signed)
2D echo attempted, but pt in radiology

## 2018-10-03 NOTE — H&P (Signed)
History and Physical    Katie Schroeder YTK:[id number] DOB: Aug 01, 1941 DOA: 10/02/2018  Referring MD/NP/PA:   PCP: Patient, No Pcp Per   Patient coming from:  The patient is coming from home.  At baseline, pt is independent for most of ADL.        Chief Complaint: cough, CP, fever, chills,  HPI: Katie Schroeder is a 78 y.o. female without significant past medical history, not currently taking any medications, who presents with cough, chest pain, fever and chills.  Patient states that she has been sick for about a week, including cough, runny nose, fever and chills, nasal congestion.  She states that her symptoms have improved, but developed chest pain since yestoday.  The chest pain is located in the left central chest, constant, sharp, pleuritic, aggravated by deep breath and coughing, radiating to the back.  She coughs up a little clear mucus, no shortness of breath.  Denies nausea, vomiting, diarrhea, abdominal pain, symptoms of UTI or unilateral weakness. Pt went to primary MD and was sent here for further evaluation.   ED Course: pt was found to have WBC 22.1, negative troponin, electrolytes renal function okay, temperature 99.9, tachycardia, tachypnea, oxygen saturation 88% on room air, chest x-ray showed left small to moderate amount of pleural effusion, infiltration in left lingula and left side of lung.  Patient is placed on telemetry bed of observation.  Review of Systems:   General: has fevers, chills, no body weight gain, has fatigue HEENT: no blurry vision, hearing changes or sore throat Respiratory: has dyspnea, coughing, no wheezing CV: has chest pain, no palpitations GI: no nausea, vomiting, abdominal pain, diarrhea, constipation GU: no dysuria, burning on urination, increased urinary frequency, hematuria  Ext: no leg edema Neuro: no unilateral weakness, numbness, or tingling, no vision change or hearing loss Skin: no rash, no skin tear. MSK: No muscle spasm, no deformity,  no limitation of range of movement in spin Heme: No easy bruising.  Travel history: No recent long distant travel.  Allergy: No Known Allergies  History reviewed. No pertinent past medical history.  Past Surgical History:  Procedure Laterality Date  . CATARACT EXTRACTION    . CESAREAN SECTION      Social History:  reports that she has never smoked. She has never used smokeless tobacco. She reports previous alcohol use. She reports that she does not use drugs.  Family History:  Family History  Problem Relation Age of Onset  . Dementia Mother   . CAD Father      Prior to Admission medications   Medication Sig Start Date End Date Taking? Authorizing Provider  amoxicillin (AMOXIL) 500 MG capsule Take 2 capsules (1,000 mg total) by mouth 3 (three) times daily. 10/03/18   Raeford Razor, MD  azithromycin (ZITHROMAX) 250 MG tablet Take 1 tablet (250 mg total) by mouth daily. Take first 2 tablets together, then 1 every day until finished. 10/03/18   Raeford Razor, MD  guaiFENesin-codeine [facility]) 100-10 MG/5ML syrup Take 5 mLs by mouth 3 (three) times daily as needed for cough. 10/03/18   Raeford Razor, MD  meloxicam (MOBIC) 7.5 MG tablet Take 2 tablets (15 mg total) by mouth daily as needed for pain. 10/03/18   Raeford Razor, MD    Physical Exam: Vitals:   10/03/18 0015 10/03/18 0030 10/03/18 0045 10/03/18 0100  BP: (!) 111/54 108/61 (!) 103/56 115/62  Pulse: (!) 104 (!) 105 (!) 104 (!) 103  Resp: (!) 30 (!) 23 (!) 30 (!)  25  Temp:      TempSrc:      SpO2: 93% 92% (!) 88% 98%  Weight:      Height:       General: Not in acute distress HEENT:       Eyes: PERRL, EOMI, no scleral icterus.       ENT: No discharge from the ears and nose, no pharynx injection, no tonsillar enlargement.        Neck: No JVD, no bruit, no mass felt. Heme: No neck lymph node enlargement. Cardiac: S1/S2, RRR, No murmurs, No gallops or rubs. Respiratory: No rales, wheezing, rhonchi or rubs. GI:  Soft, nondistended, nontender, no rebound pain, no organomegaly, BS present. GU: No hematuria Ext: No pitting leg edema bilaterally. 2+DP/PT pulse bilaterally. Musculoskeletal: No joint deformities, No joint redness or warmth, no limitation of ROM in spin. Skin: No rashes.  Neuro: Alert, oriented X3, cranial nerves II-XII grossly intact, moves all extremities normally. Psych: Patient is not psychotic, no suicidal or hemocidal ideation.  Labs on Admission: I have personally reviewed following labs and imaging studies  CBC: Recent Labs  Lab 10/02/18 1746  WBC 22.1*  HGB 11.2*  HCT 34.9*  MCV 95.6  PLT 348   Basic Metabolic Panel: Recent Labs  Lab 10/02/18 1746  NA 140  K 3.7  CL 105  CO2 23  GLUCOSE 175*  BUN 11  CREATININE 0.82  CALCIUM 9.1   GFR: Estimated Creatinine Clearance: 49.3 mL/min (by C-G formula based on SCr of 0.82 mg/dL). Liver Function Tests: No results for input(s): AST, ALT, ALKPHOS, BILITOT, PROT, ALBUMIN in the last 168 hours. No results for input(s): LIPASE, AMYLASE in the last 168 hours. No results for input(s): AMMONIA in the last 168 hours. Coagulation Profile: No results for input(s): INR, PROTIME in the last 168 hours. Cardiac Enzymes: No results for input(s): CKTOTAL, CKMB, CKMBINDEX, TROPONINI in the last 168 hours. BNP (last 3 results) No results for input(s): PROBNP in the last 8760 hours. HbA1C: No results for input(s): HGBA1C in the last 72 hours. CBG: No results for input(s): GLUCAP in the last 168 hours. Lipid Profile: No results for input(s): CHOL, HDL, LDLCALC, TRIG, CHOLHDL, LDLDIRECT in the last 72 hours. Thyroid Function Tests: No results for input(s): TSH, T4TOTAL, FREET4, T3FREE, THYROIDAB in the last 72 hours. Anemia Panel: No results for input(s): VITAMINB12, FOLATE, FERRITIN, TIBC, IRON, RETICCTPCT in the last 72 hours. Urine analysis: No results found for: COLORURINE, APPEARANCEUR, LABSPEC, PHURINE, GLUCOSEU, HGBUR,  BILIRUBINUR, KETONESUR, PROTEINUR, UROBILINOGEN, NITRITE, LEUKOCYTESUR Sepsis Labs: @LABRCNTIP (procalcitonin:4,lacticidven:4) )No results found for this or any previous visit (from the past 240 hour(s)).   Radiological Exams on Admission: Dg Chest 2 View  Result Date: 10/02/2018 CLINICAL DATA:  Left-sided chest pain EXAM: CHEST - 2 VIEW COMPARISON:  None. FINDINGS: Small moderate left pleural effusion, possibly slightly loculated along the lateral chest. Consolidation at the lingula and left lung base. Mild cardiomegaly. No pneumothorax. Degenerative changes of the spine. IMPRESSION: Small moderate left pleural effusion with airspace disease at the lingula and left lung base, possible pneumonia. Electronically Signed   By: Jasmine Pang M.D.   On: 10/02/2018 19:45     EKG: Independently reviewed.  Sinus rhythm, QTC 419, tachycardia, low voltage, nonspecific T wave change.  Assessment/Plan Principal Problem:   Lobar pneumonia (HCC) Active Problems:   Sepsis (HCC)   Acute on chronic respiratory failure with hypoxia (HCC)   Acute on chronic respiratory failure with hypoxia and sepsis duet lobar  pneumonia: Patient has cough, pleuritic chest pain, fever and chills, infiltration on chest x-ray, clinically consistent with pneumonia.  Patient meets criteria for sepsis with leukocytosis, tachycardia and tachypnea.  Also has fever.  Currently hemodynamically stable.  - Will place on telemetry bed for obs. - IV Rocephin and azithromycin - Mucinex for cough  - Xopenex Neb prn for SOB - Urine legionella and S. pneumococcal antigen - Follow up blood culture x2, sputum culture and plus Flu pcr - will get Procalcitonin and trend lactic acid level per sepsis protocol - IVF: 2L of NS bolus in ED, followed by 125 mL per hour of NS       DVT ppx: SQ Lovenox Code Status: Full code Family Communication: Yes, patient's husband   at bed side Disposition Plan:  Anticipate discharge back to previous  home environment Consults called: None Admission status: Obs / tele    Date of Service 10/03/2018    Lorretta HarpXilin Ailine Hefferan [facility] Pager [phone]  If 7PM-7AM, please contact night-coverage [email or web address] Password [email or web address] 10/03/2018, 1:31 AM

## 2018-10-03 NOTE — Consult Note (Signed)
Reason for Consult:Left parapneumonic effusion Referring Physician: [facility]  Katie Schroeder is an 77 y.o. female.  HPI: 77 yo woman with no significant past medical history presented with cc/o left sided pleuritic chest pain. Was in her usual state of health until about a week and a half ago when she "came down with a cold." Mostly URI symptoms of nasal congestion, malaise and low grade fever initially. But 2 days ago developed left sided chest pain with cough and deep breathing. Cough has been nonproductive. Pain left chest, radiates to back. Constant now but aggravated by coughing and talking. She went to her primary who sent her on to the ED yesterday. A CXR showed a possible pneumonia with a small to moderate effusion. Her WBC was 22 K. Had a low grade temp of 99.9.  She was admitted and started on IV antibiotics with azithromycin and ceftriaxone.   A CT chest showed left lower lobe airspace disease with a complex loculated pleural effusion.  Her symptoms have improved since admission although still present.  History reviewed. No pertinent past medical history.  Past Surgical History:  Procedure Laterality Date  . CATARACT EXTRACTION    . CESAREAN SECTION      Family History  Problem Relation Age of Onset  . Dementia Mother   . CAD Father     Social History:  reports that she has never smoked. She has never used smokeless tobacco. She reports previous alcohol use. She reports that she does not use drugs.  Allergies: No Known Allergies  Medications:  Scheduled: . enoxaparin (LOVENOX) injection  40 mg Subcutaneous Q24H    Results for orders placed or performed during the hospital encounter of 10/02/18 (from the past 48 hour(s))  Basic metabolic panel     Status: Abnormal   Collection Time: 10/02/18  5:46 PM  Result Value Ref Range   Sodium 140 135 - 145 mmol/L   Potassium 3.7 3.5 - 5.1 mmol/L   Chloride 105 98 - 111 mmol/L   CO2 23 22 - 32 mmol/L   Glucose, Bld 175 (H) 70  - 99 mg/dL   BUN 11 8 - 23 mg/dL   Creatinine, Ser 0.82 0.44 - 1.00 mg/dL   Calcium 9.1 8.9 - 10.3 mg/dL   GFR calc non Af Amer >60 >60 mL/min   GFR calc Af Amer >60 >60 mL/min   Anion gap 12 5 - 15    Comment: Performed at [facility], [address] [place], [place] [address]  CBC     Status: Abnormal   Collection Time: 10/02/18  5:46 PM  Result Value Ref Range   WBC 22.1 (H) 4.0 - 10.5 K/uL   RBC 3.65 (L) 3.87 - 5.11 MIL/uL   Hemoglobin 11.2 (L) 12.0 - 15.0 g/dL   HCT 34.9 (L) 36.0 - 46.0 %   MCV 95.6 80.0 - 100.0 fL   MCH 30.7 26.0 - 34.0 pg   MCHC 32.1 30.0 - 36.0 g/dL   RDW 14.0 11.5 - 15.5 %   Platelets 348 150 - 400 K/uL   nRBC 0.0 0.0 - 0.2 %    Comment: Performed at [facility], [address] [place], [place] [address]  I-stat troponin, ED     Status: None   Collection Time: 10/02/18  5:56 PM  Result Value Ref Range   Troponin i, poc 0.03 0.00 - 0.08 ng/mL   Comment 3              Comment: Due to the release kinetics of cTnI, a negative result within the first hours of the onset of symptoms does not rule out myocardial infarction with certainty. If myocardial infarction is still suspected, repeat the test at appropriate intervals.   Lactic acid, plasma     Status: None   Collection Time: 10/03/18  1:48 AM  Result Value Ref Range   Lactic Acid, Venous 0.8 0.5 - 1.9 mmol/L    Comment: Performed at [facility], [address] [place], [place] [address]  Procalcitonin     Status: None   Collection Time: 10/03/18  1:48 AM  Result Value Ref Range   Procalcitonin 0.48 ng/mL    Comment:        Interpretation: PCT (Procalcitonin) <= 0.5 ng/mL: Systemic infection (sepsis) is not likely. Local bacterial infection is possible. (NOTE)       Sepsis PCT Algorithm           Lower Respiratory Tract                                      Infection PCT Algorithm    ----------------------------     ----------------------------         PCT < 0.25 ng/mL                 PCT < 0.10 ng/mL         Strongly encourage             Strongly discourage   discontinuation of antibiotics    initiation of antibiotics    ----------------------------     -----------------------------       PCT 0.25 - 0.50 ng/mL            PCT 0.10 - 0.25 ng/mL               OR       >80% decrease in PCT            Discourage initiation of                                            antibiotics      Encourage discontinuation           of antibiotics    ----------------------------     -----------------------------         PCT >= 0.50 ng/mL              PCT 0.26 - 0.50 ng/mL               AND        <80% decrease in PCT             Encourage initiation of                                             antibiotics       Encourage continuation           of antibiotics    ----------------------------     -----------------------------        PCT >= 0.50 ng/mL                    PCT > 0.50 ng/mL               AND         increase in PCT                  Strongly encourage                                      initiation of antibiotics    Strongly encourage escalation           of antibiotics                                     -----------------------------                                           PCT <= 0.25 ng/mL                                                 OR                                        > 80% decrease in PCT                                     Discontinue / Do not initiate                                             antibiotics Performed at [facility], [address] [place], [place] [address]   Glucose, capillary     Status: Abnormal   Collection Time: 10/03/18  2:45 AM  Result Value Ref Range   Glucose-Capillary 112 (H) 70 - 99 mg/dL  Influenza panel by PCR (type A & B)     Status: None   Collection Time: 10/03/18  3:23 AM  Result Value Ref Range   Influenza A By PCR NEGATIVE NEGATIVE   Influenza B By PCR NEGATIVE NEGATIVE    Comment:  (NOTE) The Xpert Xpress Flu assay is intended as an aid in the diagnosis of  influenza and should not be used as a sole basis for treatment.  This  assay is FDA approved for nasopharyngeal swab specimens only. Nasal  washings and aspirates are unacceptable for Xpert Xpress Flu testing. Performed at [facility], [address] [place], [place] [address]   Lactic acid, plasma     Status: None   Collection Time: 10/03/18  6:36 AM  Result Value Ref Range   Lactic Acid, Venous 0.9 0.5 - 1.9 mmol/L    Comment: Performed at [facility], [address] [place], [place] [address]  CBC     Status: Abnormal   Collection Time: 10/03/18  7:35 AM  Result Value Ref Range     WBC 18.9 (H) 4.0 - 10.5 K/uL   RBC 2.97 (L) 3.87 - 5.11 MIL/uL   Hemoglobin 9.2 (L) 12.0 - 15.0 g/dL   HCT 28.5 (L) 36.0 - 46.0 %   MCV 96.0 80.0 - 100.0 fL   MCH 31.0 26.0 - 34.0 pg   MCHC 32.3 30.0 - 36.0 g/dL   RDW 14.2 11.5 - 15.5 %   Platelets 288 150 - 400 K/uL   nRBC 0.0 0.0 - 0.2 %    Comment: Performed at [facility], [address] [place], [place] [address]  Basic metabolic panel     Status: Abnormal   Collection Time: 10/03/18  7:35 AM  Result Value Ref Range   Sodium 141 135 - 145 mmol/L   Potassium 3.5 3.5 - 5.1 mmol/L   Chloride 111 98 - 111 mmol/L   CO2 21 (L) 22 - 32 mmol/L   Glucose, Bld 115 (H) 70 - 99 mg/dL   BUN 10 8 - 23 mg/dL   Creatinine, Ser 0.73 0.44 - 1.00 mg/dL   Calcium 7.6 (L) 8.9 - 10.3 mg/dL   GFR calc non Af Amer >60 >60 mL/min   GFR calc Af Amer >60 >60 mL/min   Anion gap 9 5 - 15    Comment: Performed at [facility], [address] [place], [place] [address]  Strep pneumoniae urinary antigen     Status: None   Collection Time: 10/03/18  8:55 AM  Result Value Ref Range   Strep Pneumo Urinary Antigen NEGATIVE NEGATIVE    Comment:        Infection due to S. pneumoniae cannot be absolutely ruled out since the antigen present may be below the detection  limit of the test. Performed at [facility], [address] [place], [place] [address]   Lactate dehydrogenase     Status: Abnormal   Collection Time: 10/03/18  1:27 PM  Result Value Ref Range   LDH 194 (H) 98 - 192 U/L    Comment: Performed at [facility], [address] [place], [place] [address]  Hepatic function panel     Status: Abnormal   Collection Time: 10/03/18  1:27 PM  Result Value Ref Range   Total Protein 6.2 (L) 6.5 - 8.1 g/dL   Albumin 2.5 (L) 3.5 - 5.0 g/dL   AST 36 15 - 41 U/L   ALT 45 (H) 0 - 44 U/L   Alkaline Phosphatase 128 (H) 38 - 126 U/L   Total Bilirubin 0.7 0.3 - 1.2 mg/dL   Bilirubin, Direct 0.2 0.0 - 0.2 mg/dL   Indirect Bilirubin 0.5 0.3 - 0.9 mg/dL    Comment: Performed at  Hospital Lab, [address] [place], [place] [address]    Dg Chest 2 View  Result Date: 10/02/2018 CLINICAL DATA:  Left-sided chest pain EXAM: CHEST - 2 VIEW COMPARISON:  None. FINDINGS: Small moderate left pleural effusion, possibly slightly loculated along the lateral chest. Consolidation at the lingula and left lung base. Mild cardiomegaly. No pneumothorax. Degenerative changes of the spine. IMPRESSION: Small moderate left pleural effusion with airspace disease at the lingula and left lung base, possible pneumonia. Electronically Signed   By: Kim  Fujinaga M.D.   On: 10/02/2018 19:45   Ct Chest W Contrast  Result Date: 10/03/2018 CLINICAL DATA:  Increasing shortness of breath EXAM: CT CHEST WITH CONTRAST TECHNIQUE: Multidetector CT imaging of the chest was performed during intravenous contrast administration. CONTRAST:    75mL OMNIPAQUE IOHEXOL 300 MG/ML  SOLN COMPARISON:  Plain film from earlier in the same day. FINDINGS: Cardiovascular: Thoracic aorta demonstrates atherosclerotic calcifications without aneurysmal dilatation or dissection. Heart is at the upper limits of normal in size. Coronary calcifications are noted. No large central pulmonary embolus is seen.  Peripheral branch evaluation is limited. Mediastinum/Nodes: Thoracic inlet is within normal limits. Scattered small mediastinal lymph nodes are noted likely reactive in nature. No sizable adenopathy is seen. Lungs/Pleura: Small right pleural effusion is noted. More sizable left pleural effusion is noted with areas of loculation along the chest wall particularly laterally and near the apex. Right lung is well aerated. A somewhat spiculated density is noted in the right upper lobe best seen on image number 57 of series 7. This measures approximately 2 cm in greatest dimension consolidation is noted within the left lower lobe. No sizable nodule on the left is seen. Upper Abdomen: No acute abnormality. Musculoskeletal: Degenerative changes of the thoracic spine are noted. IMPRESSION: Consolidation in the left lower lobe as well as loculated left pleural effusion which appears worsened when compared with the prior plain film of 10/02/2018. Somewhat spiculated lesion in the right upper lobe measuring 2 cm in greatest dimension. Although this may be postinflammatory in nature and related to the changes on the left side, follow-up CT examination in 3 months is recommended. Aortic Atherosclerosis (ICD10-I70.0). Electronically Signed   By: Mark  Lukens M.D.   On: 10/03/2018 11:08   Dg Chest Port 1 View  Result Date: 10/03/2018 CLINICAL DATA:  Short of breath EXAM: PORTABLE CHEST 1 VIEW COMPARISON:  10/02/2018 FINDINGS: The heart remains moderately enlarged. Left pleural effusion which is likely partially loculated has increased. Opacity at the left base has also increased likely combination of pleural fluid and consolidation. Small right pleural effusion has developed. No pneumothorax. IMPRESSION: Increasing left pleural effusion which appears partially loculated. Increasing opacity at the left base. New small right pleural effusion. Electronically Signed   By: Arthur  Hoss M.D.   On: 10/03/2018 09:15   I personally  reviewed the CT and CXR images and concur with the findings noted above  Review of Systems  Constitutional: Positive for fever and malaise/fatigue. Negative for chills.  HENT: Positive for congestion.   Respiratory: Positive for cough and shortness of breath.   Cardiovascular: Positive for chest pain (pleuritic left sided).  Gastrointestinal: Negative for nausea and vomiting.  Genitourinary: Negative for dysuria and frequency.  Musculoskeletal: Positive for back pain. Negative for joint pain.  Neurological: Negative for focal weakness and loss of consciousness.  All other systems reviewed and are negative.  Blood pressure 132/66, pulse 98, temperature 99.1 F (37.3 C), temperature source Oral, resp. rate 18, height 5' 1" (1.549 m), weight 66.4 kg, SpO2 98 %. Physical Exam  Vitals reviewed. Constitutional: She is oriented to person, place, and time. She appears well-developed and well-nourished. No distress.  HENT:  Head: Normocephalic and atraumatic.  Mouth/Throat: No oropharyngeal exudate.  Eyes: Conjunctivae and EOM are normal. No scleral icterus.  Neck: No thyromegaly present.  Cardiovascular: Normal rate, normal heart sounds and intact distal pulses. Exam reveals no gallop and no friction rub.  No murmur heard. Respiratory: Effort normal. No respiratory distress. She has no wheezes.      Diminished BS left lower lung field  GI: She exhibits no distension. There is no abdominal tenderness.  Musculoskeletal:        General: No deformity or edema.  Lymphadenopathy:    She has   no cervical adenopathy.  Neurological: She is alert and oriented to person, place, and time. No cranial nerve deficit. She exhibits normal muscle tone. Coordination normal.  Skin: Skin is warm and dry.    Assessment/Plan: 77 yo woman with no significant past history presents with fever, cough, and left sided pleuritic chest pain. Febrile with elevated WBC. CXR and CT show left lower pneumonia with a  loculated parapneumonic effusion. She needs the effusion drained to allow pneumonia to be treated and preserve lung function.  I recommended we proceed with left VATS for drainage of effusion and possible decortication.   I discussed the general nature of the procedure, the need for general anesthesia, the incisions to be used and the need for drainage tubes postoperatively with Katie Schroeder. We discussed the expected hospital stay, overall recovery and short and long term outcomes. I informed her of the indications, risks, benefits and alternatives. She understands the risks include, but are not limited to death, stroke, MI, DVT/PE, bleeding, possible need for transfusion, infections, prolonged air leaks, cardiac arrhythmias, as well as other organ system dysfunction including respiratory, renal, or GI complications.   She accepts the risks and agrees to proceed.  Plan OR tomorrow AM  Katie Schroeder 10/03/2018, 2:40 PM     

## 2018-10-04 ENCOUNTER — Inpatient Hospital Stay (HOSPITAL_COMMUNITY): Payer: Medicare Other | Admitting: Certified Registered Nurse Anesthetist

## 2018-10-04 ENCOUNTER — Encounter (HOSPITAL_COMMUNITY)
Admission: EM | Disposition: A | Discharge status: Home Health | Payer: Self-pay | Source: Home / Self Care | Attending: Thoracic Surgery (Cardiothoracic Vascular Surgery)

## 2018-10-04 ENCOUNTER — Inpatient Hospital Stay (HOSPITAL_COMMUNITY): Payer: Medicare Other

## 2018-10-04 DIAGNOSIS — J869 Pyothorax without fistula: Secondary | ICD-10-CM

## 2018-10-04 HISTORY — PX: PLEURAL EFFUSION DRAINAGE: SHX5099

## 2018-10-04 HISTORY — PX: VIDEO ASSISTED THORACOSCOPY: SHX5073

## 2018-10-04 HISTORY — DX: Pyothorax without fistula: J86.9

## 2018-10-04 HISTORY — PX: DECORTICATION: SHX5101

## 2018-10-04 LAB — BLOOD GAS, ARTERIAL
Acid-Base Excess: 1.4 mmol/L (ref 0.0–2.0)
Bicarbonate: 24.7 mmol/L (ref 20.0–28.0)
Drawn by: 36496
FIO2: 0.21
O2 Saturation: 90.5 %
PATIENT TEMPERATURE: 100
PO2 ART: 56.7 mmHg — AB (ref 83.0–108.0)
pCO2 arterial: 35.2 mmHg (ref 32.0–48.0)
pH, Arterial: 7.465 — ABNORMAL HIGH (ref 7.350–7.450)

## 2018-10-04 LAB — CBC
HCT: 28.1 % — ABNORMAL LOW (ref 36.0–46.0)
Hemoglobin: 9.2 g/dL — ABNORMAL LOW (ref 12.0–15.0)
MCH: 30.8 pg (ref 26.0–34.0)
MCHC: 32.7 g/dL (ref 30.0–36.0)
MCV: 94 fL (ref 80.0–100.0)
PLATELETS: 328 10*3/uL (ref 150–400)
RBC: 2.99 MIL/uL — AB (ref 3.87–5.11)
RDW: 14.1 % (ref 11.5–15.5)
WBC: 18.7 10*3/uL — ABNORMAL HIGH (ref 4.0–10.5)
nRBC: 0 % (ref 0.0–0.2)

## 2018-10-04 LAB — COMPREHENSIVE METABOLIC PANEL
ALT: 39 U/L (ref 0–44)
AST: 30 U/L (ref 15–41)
Albumin: 2.1 g/dL — ABNORMAL LOW (ref 3.5–5.0)
Alkaline Phosphatase: 112 U/L (ref 38–126)
Anion gap: 10 (ref 5–15)
BUN: 11 mg/dL (ref 8–23)
CHLORIDE: 107 mmol/L (ref 98–111)
CO2: 24 mmol/L (ref 22–32)
Calcium: 8.2 mg/dL — ABNORMAL LOW (ref 8.9–10.3)
Creatinine, Ser: 0.69 mg/dL (ref 0.44–1.00)
GFR calc Af Amer: >60 mL/min (ref >60)
GFR calc non Af Amer: >60 mL/min (ref >60)
Glucose, Bld: 124 mg/dL — ABNORMAL HIGH (ref 70–99)
Potassium: 3.1 mmol/L — ABNORMAL LOW (ref 3.5–5.1)
Sodium: 141 mmol/L (ref 135–145)
Total Bilirubin: 0.5 mg/dL (ref 0.3–1.2)
Total Protein: 5 g/dL — ABNORMAL LOW (ref 6.5–8.1)

## 2018-10-04 LAB — GLUCOSE, CAPILLARY
Glucose-Capillary: 145 mg/dL — ABNORMAL HIGH (ref 70–99)
Glucose-Capillary: 121 mg/dL — ABNORMAL HIGH (ref 70–99)
Glucose-Capillary: 130 mg/dL — ABNORMAL HIGH (ref 70–99)
Glucose-Capillary: 238 mg/dL — ABNORMAL HIGH (ref 70–99)

## 2018-10-04 LAB — URINALYSIS, ROUTINE W REFLEX MICROSCOPIC
Bilirubin Urine: NEGATIVE
Glucose, UA: NEGATIVE mg/dL
HGB URINE DIPSTICK: NEGATIVE
Ketones, ur: NEGATIVE mg/dL
Leukocytes, UA: NEGATIVE
NITRITE: NEGATIVE
Protein, ur: 30 mg/dL — AB
Specific Gravity, Urine: 1.034 — ABNORMAL HIGH (ref 1.005–1.030)
pH: 5 (ref 5.0–8.0)

## 2018-10-04 LAB — MAGNESIUM: Magnesium: 2 mg/dL (ref 1.7–2.4)

## 2018-10-04 SURGERY — VIDEO ASSISTED THORACOSCOPY
Anesthesia: General | Site: Chest | Laterality: Left

## 2018-10-04 MED ORDER — LIDOCAINE 2% (20 MG/ML) 5 ML SYRINGE
INTRAMUSCULAR | Status: AC
  Filled 2018-10-04: qty 5

## 2018-10-04 MED ORDER — POTASSIUM CHLORIDE CRYS ER 20 MEQ PO TBCR: 40.0000 meq | EXTENDED_RELEASE_TABLET | Freq: Once | ORAL | Status: DC

## 2018-10-04 MED ORDER — TRAMADOL HCL 50 MG PO TABS
50.0000 mg | ORAL_TABLET | Freq: Four times a day (QID) | ORAL | Status: DC | PRN
  Administered 2018-10-06: 50 mg via ORAL
  Administered 2018-10-07: 100 mg via ORAL
  Administered 2018-10-07: 50 mg via ORAL
  Filled 2018-10-04 (×2): qty 1
  Filled 2018-10-04: qty 2

## 2018-10-04 MED ORDER — SODIUM CHLORIDE 0.9 % IV SOLN
INTRAVENOUS | Status: DC | PRN
  Administered 2018-10-04: 11:00:00

## 2018-10-04 MED ORDER — SODIUM CHLORIDE 0.9 % IV SOLN
INTRAVENOUS | Status: DC
  Administered 2018-10-04: 15:00:00 via INTRAVENOUS

## 2018-10-04 MED ORDER — PROPOFOL 10 MG/ML IV BOLUS
INTRAVENOUS | Status: DC | PRN
  Administered 2018-10-04: 30 mg via INTRAVENOUS
  Administered 2018-10-04: 40 mg via INTRAVENOUS

## 2018-10-04 MED ORDER — ONDANSETRON HCL 4 MG/2ML IJ SOLN
INTRAMUSCULAR | Status: AC
  Filled 2018-10-04: qty 2

## 2018-10-04 MED ORDER — ONDANSETRON HCL 4 MG/2ML IJ SOLN: 4.0000 mg | Freq: Four times a day (QID) | INTRAMUSCULAR | Status: DC | PRN

## 2018-10-04 MED ORDER — MIDAZOLAM HCL 5 MG/5ML IJ SOLN
INTRAMUSCULAR | Status: DC | PRN
  Administered 2018-10-04: 1 mg via INTRAVENOUS

## 2018-10-04 MED ORDER — ACETAMINOPHEN 160 MG/5ML PO SOLN: 1000.0000 mg | Freq: Four times a day (QID) | ORAL | Status: DC

## 2018-10-04 MED ORDER — FENTANYL CITRATE (PF) 100 MCG/2ML IJ SOLN
INTRAMUSCULAR | Status: DC | PRN
  Administered 2018-10-04: 50 ug via INTRAVENOUS
  Administered 2018-10-04 (×2): 100 ug via INTRAVENOUS

## 2018-10-04 MED ORDER — SODIUM CHLORIDE (PF) 0.9 % IJ SOLN
INTRAMUSCULAR | Status: DC | PRN
  Administered 2018-10-04: 50 mL via INTRAVENOUS

## 2018-10-04 MED ORDER — SODIUM CHLORIDE 0.9 % IV SOLN
INTRAVENOUS | Status: DC | PRN
  Administered 2018-10-04: 40 ug/min via INTRAVENOUS

## 2018-10-04 MED ORDER — LEVALBUTEROL HCL 1.25 MG/0.5ML IN NEBU: 1.2500 mg | INHALATION_SOLUTION | Freq: Four times a day (QID) | RESPIRATORY_TRACT | Status: DC | PRN

## 2018-10-04 MED ORDER — PHENYLEPHRINE HCL 10 MG/ML IJ SOLN
INTRAMUSCULAR | Status: DC | PRN
  Administered 2018-10-04: 80 ug via INTRAVENOUS

## 2018-10-04 MED ORDER — PROPOFOL 10 MG/ML IV BOLUS
INTRAVENOUS | Status: AC
  Filled 2018-10-04: qty 20

## 2018-10-04 MED ORDER — LIDOCAINE HCL (CARDIAC) PF 100 MG/5ML IV SOSY
PREFILLED_SYRINGE | INTRAVENOUS | Status: DC | PRN
  Administered 2018-10-04: 40 mg via INTRAVENOUS

## 2018-10-04 MED ORDER — BISACODYL 5 MG PO TBEC
10.0000 mg | DELAYED_RELEASE_TABLET | Freq: Every day | ORAL | Status: DC
  Administered 2018-10-04 – 2018-10-07 (×3): 10 mg via ORAL
  Filled 2018-10-04 (×4): qty 2

## 2018-10-04 MED ORDER — INSULIN ASPART 100 UNIT/ML ~~LOC~~ SOLN
0.0000 [IU] | SUBCUTANEOUS | Status: DC
  Administered 2018-10-04: 8 [IU] via SUBCUTANEOUS
  Administered 2018-10-04 – 2018-10-07 (×3): 2 [IU] via SUBCUTANEOUS

## 2018-10-04 MED ORDER — NALOXONE HCL 0.4 MG/ML IJ SOLN: 0.4000 mg | INTRAMUSCULAR | Status: DC | PRN

## 2018-10-04 MED ORDER — SODIUM CHLORIDE 0.9% FLUSH: 9.0000 mL | INTRAVENOUS | Status: DC | PRN

## 2018-10-04 MED ORDER — MIDAZOLAM HCL 2 MG/2ML IJ SOLN
INTRAMUSCULAR | Status: AC
  Filled 2018-10-04: qty 2

## 2018-10-04 MED ORDER — POTASSIUM CHLORIDE 10 MEQ/50ML IV SOLN: 10.0000 meq | Freq: Every day | INTRAVENOUS | Status: DC | PRN

## 2018-10-04 MED ORDER — BUPIVACAINE HCL (PF) 0.5 % IJ SOLN
INTRAMUSCULAR | Status: DC | PRN
  Administered 2018-10-04: 30 mL

## 2018-10-04 MED ORDER — DEXAMETHASONE SODIUM PHOSPHATE 10 MG/ML IJ SOLN
INTRAMUSCULAR | Status: AC
  Filled 2018-10-04: qty 1

## 2018-10-04 MED ORDER — FENTANYL CITRATE (PF) 250 MCG/5ML IJ SOLN
INTRAMUSCULAR | Status: AC
  Filled 2018-10-04: qty 5

## 2018-10-04 MED ORDER — SUGAMMADEX SODIUM 200 MG/2ML IV SOLN
INTRAVENOUS | Status: DC | PRN
  Administered 2018-10-04: 300 mg via INTRAVENOUS

## 2018-10-04 MED ORDER — FENTANYL 40 MCG/ML IV SOLN
INTRAVENOUS | Status: DC
  Administered 2018-10-04: 30 ug via INTRAVENOUS
  Administered 2018-10-04: 10 ug via INTRAVENOUS
  Administered 2018-10-04: 40 ug via INTRAVENOUS
  Administered 2018-10-04: 1000 ug via INTRAVENOUS
  Administered 2018-10-05: 20 ug via INTRAVENOUS
  Administered 2018-10-05 – 2018-10-06 (×2): 40 ug via INTRAVENOUS
  Administered 2018-10-06: 30 ug via INTRAVENOUS
  Filled 2018-10-04: qty 1000

## 2018-10-04 MED ORDER — VANCOMYCIN HCL IN DEXTROSE 1-5 GM/200ML-% IV SOLN
1000.0000 mg | Freq: Two times a day (BID) | INTRAVENOUS | Status: AC
  Administered 2018-10-04: 1000 mg via INTRAVENOUS
  Filled 2018-10-04: qty 200

## 2018-10-04 MED ORDER — ONDANSETRON HCL 4 MG/2ML IJ SOLN
INTRAMUSCULAR | Status: DC | PRN
  Administered 2018-10-04: 4 mg via INTRAVENOUS

## 2018-10-04 MED ORDER — ROCURONIUM BROMIDE 100 MG/10ML IV SOLN
INTRAVENOUS | Status: DC | PRN
  Administered 2018-10-04: 50 mg via INTRAVENOUS
  Administered 2018-10-04: 20 mg via INTRAVENOUS

## 2018-10-04 MED ORDER — POTASSIUM CHLORIDE 10 MEQ/100ML IV SOLN
10.0000 meq | INTRAVENOUS | Status: DC
  Administered 2018-10-04 (×2): 10 meq via INTRAVENOUS
  Filled 2018-10-04 (×2): qty 100

## 2018-10-04 MED ORDER — OXYCODONE HCL 5 MG PO TABS
5.0000 mg | ORAL_TABLET | ORAL | Status: DC | PRN
  Administered 2018-10-06: 5 mg via ORAL
  Filled 2018-10-04: qty 1

## 2018-10-04 MED ORDER — ROCURONIUM BROMIDE 50 MG/5ML IV SOSY
PREFILLED_SYRINGE | INTRAVENOUS | Status: AC
  Filled 2018-10-04: qty 10

## 2018-10-04 MED ORDER — BUPIVACAINE LIPOSOME 1.3 % IJ SUSP
20.0000 mL | Freq: Once | INTRAMUSCULAR | Status: DC
  Filled 2018-10-04: qty 20

## 2018-10-04 MED ORDER — BUPIVACAINE HCL (PF) 0.5 % IJ SOLN
INTRAMUSCULAR | Status: AC
  Filled 2018-10-04: qty 30

## 2018-10-04 MED ORDER — PHENYLEPHRINE 40 MCG/ML (10ML) SYRINGE FOR IV PUSH (FOR BLOOD PRESSURE SUPPORT)
PREFILLED_SYRINGE | INTRAVENOUS | Status: AC
  Filled 2018-10-04: qty 10

## 2018-10-04 MED ORDER — DIPHENHYDRAMINE HCL 50 MG/ML IJ SOLN: 12.5000 mg | Freq: Four times a day (QID) | INTRAMUSCULAR | Status: DC | PRN

## 2018-10-04 MED ORDER — LACTATED RINGERS IV SOLN
INTRAVENOUS | Status: DC | PRN
  Administered 2018-10-04 (×2): via INTRAVENOUS

## 2018-10-04 MED ORDER — LACTATED RINGERS IV SOLN
INTRAVENOUS | Status: DC | PRN
  Administered 2018-10-04: 08:00:00 via INTRAVENOUS

## 2018-10-04 MED ORDER — 0.9 % SODIUM CHLORIDE (POUR BTL) OPTIME
TOPICAL | Status: DC | PRN
  Administered 2018-10-04: 2000 mL

## 2018-10-04 MED ORDER — DEXAMETHASONE SODIUM PHOSPHATE 10 MG/ML IJ SOLN
INTRAMUSCULAR | Status: DC | PRN
  Administered 2018-10-04: 10 mg via INTRAVENOUS

## 2018-10-04 MED ORDER — GUAIFENESIN ER 600 MG PO TB12
600.0000 mg | ORAL_TABLET | Freq: Two times a day (BID) | ORAL | Status: DC
  Administered 2018-10-05 – 2018-10-08 (×7): 600 mg via ORAL
  Filled 2018-10-04 (×7): qty 1

## 2018-10-04 MED ORDER — DIPHENHYDRAMINE HCL 12.5 MG/5ML PO ELIX
12.5000 mg | ORAL_SOLUTION | Freq: Four times a day (QID) | ORAL | Status: DC | PRN
  Filled 2018-10-04: qty 5

## 2018-10-04 MED ORDER — SENNOSIDES-DOCUSATE SODIUM 8.6-50 MG PO TABS
1.0000 | ORAL_TABLET | Freq: Every day | ORAL | Status: DC
  Administered 2018-10-04 – 2018-10-07 (×4): 1 via ORAL
  Filled 2018-10-04 (×4): qty 1

## 2018-10-04 MED ORDER — ACETAMINOPHEN 500 MG PO TABS
1000.0000 mg | ORAL_TABLET | Freq: Four times a day (QID) | ORAL | Status: DC
  Administered 2018-10-04 – 2018-10-08 (×13): 1000 mg via ORAL
  Filled 2018-10-04 (×15): qty 2

## 2018-10-04 SURGICAL SUPPLY — 83 items
ADH SKN CLS APL DERMABOND .7 (GAUZE/BANDAGES/DRESSINGS)
APL SWBSTK 6 STRL LF DISP (MISCELLANEOUS)
APPLICATOR COTTON TIP 6 STRL (MISCELLANEOUS) IMPLANT
APPLICATOR COTTON TIP 6IN STRL (MISCELLANEOUS) IMPLANT
APPLIER CLIP ROT 10 11.4 M/L (STAPLE)
APR CLP MED LRG 11.4X10 (STAPLE)
BAG SPEC RTRVL LRG 6X4 10 (ENDOMECHANICALS)
CANISTER SUCT 3000ML PPV (MISCELLANEOUS) ×3 IMPLANT
CATH THORACIC 28FR RT ANG (CATHETERS) IMPLANT
CATH THORACIC 36FR (CATHETERS) IMPLANT
CATH THORACIC 36FR RT ANG (CATHETERS) IMPLANT
CLIP APPLIE ROT 10 11.4 M/L (STAPLE) IMPLANT
CLIP VESOCCLUDE MED 6/CT (CLIP) IMPLANT
CONN ST 1/4X3/8  BEN (MISCELLANEOUS) ×4
CONN ST 1/4X3/8 BEN (MISCELLANEOUS) IMPLANT
CONN Y 3/8X3/8X3/8  BEN (MISCELLANEOUS) ×2
CONN Y 3/8X3/8X3/8 BEN (MISCELLANEOUS) ×1 IMPLANT
CONT SPEC 4OZ CLIKSEAL STRL BL (MISCELLANEOUS) ×6 IMPLANT
COVER SURGICAL LIGHT HANDLE (MISCELLANEOUS) ×3 IMPLANT
COVER WAND RF STERILE (DRAPES) ×3 IMPLANT
DERMABOND ADVANCED (GAUZE/BANDAGES/DRESSINGS)
DERMABOND ADVANCED .7 DNX12 (GAUZE/BANDAGES/DRESSINGS) IMPLANT
DRAIN CHANNEL 28F RND 3/8 FF (WOUND CARE) ×4 IMPLANT
DRAIN CHANNEL 32F RND 10.7 FF (WOUND CARE) IMPLANT
DRAPE LAPAROSCOPIC ABDOMINAL (DRAPES) ×3 IMPLANT
DRAPE SLUSH/WARMER DISC (DRAPES) ×3 IMPLANT
ELECT BLADE 4.0 EZ CLEAN MEGAD (MISCELLANEOUS) ×6
ELECT REM PT RETURN 9FT ADLT (ELECTROSURGICAL) ×3
ELECTRODE BLDE 4.0 EZ CLN MEGD (MISCELLANEOUS) IMPLANT
ELECTRODE REM PT RTRN 9FT ADLT (ELECTROSURGICAL) ×1 IMPLANT
GAUZE SPONGE 4X4 12PLY STRL (GAUZE/BANDAGES/DRESSINGS) ×3 IMPLANT
GAUZE SPONGE 4X4 12PLY STRL LF (GAUZE/BANDAGES/DRESSINGS) ×2 IMPLANT
GLOVE SURG SIGNA 7.5 PF LTX (GLOVE) ×6 IMPLANT
GLOVE SURG SS PI 6.0 STRL IVOR (GLOVE) ×2 IMPLANT
GOWN STRL REUS W/ TWL LRG LVL3 (GOWN DISPOSABLE) ×2 IMPLANT
GOWN STRL REUS W/ TWL XL LVL3 (GOWN DISPOSABLE) ×1 IMPLANT
GOWN STRL REUS W/TWL LRG LVL3 (GOWN DISPOSABLE) ×9
GOWN STRL REUS W/TWL XL LVL3 (GOWN DISPOSABLE) ×3
HEMOSTAT SURGICEL 2X14 (HEMOSTASIS) IMPLANT
IV CATH 22GX1 FEP (IV SOLUTION) IMPLANT
KIT BASIN OR (CUSTOM PROCEDURE TRAY) ×3 IMPLANT
KIT SUCTION CATH 14FR (SUCTIONS) ×3 IMPLANT
KIT TURNOVER KIT B (KITS) ×3 IMPLANT
NDL SPNL 18GX3.5 QUINCKE PK (NEEDLE) IMPLANT
NDL SPNL 22GX3.5 QUINCKE BK (NEEDLE) IMPLANT
NEEDLE SPNL 18GX3.5 QUINCKE PK (NEEDLE) ×3 IMPLANT
NEEDLE SPNL 22GX3.5 QUINCKE BK (NEEDLE) ×3 IMPLANT
NS IRRIG 1000ML POUR BTL (IV SOLUTION) ×6 IMPLANT
PACK CHEST (CUSTOM PROCEDURE TRAY) ×3 IMPLANT
PAD ARMBOARD 7.5X6 YLW CONV (MISCELLANEOUS) ×6 IMPLANT
POUCH ENDO CATCH II 15MM (MISCELLANEOUS) IMPLANT
POUCH SPECIMEN RETRIEVAL 10MM (ENDOMECHANICALS) IMPLANT
SEALANT PROGEL (MISCELLANEOUS) IMPLANT
SEALANT SURG COSEAL 4ML (VASCULAR PRODUCTS) IMPLANT
SEALANT SURG COSEAL 8ML (VASCULAR PRODUCTS) IMPLANT
SOLUTION ANTI FOG 6CC (MISCELLANEOUS) ×5 IMPLANT
SPECIMEN JAR MEDIUM (MISCELLANEOUS) ×3 IMPLANT
SPONGE INTESTINAL PEANUT (DISPOSABLE) IMPLANT
SPONGE TONSIL TAPE 1 RFD (DISPOSABLE) ×5 IMPLANT
SUT PROLENE 4 0 RB 1 (SUTURE)
SUT PROLENE 4-0 RB1 .5 CRCL 36 (SUTURE) IMPLANT
SUT SILK  1 MH (SUTURE) ×6
SUT SILK 1 MH (SUTURE) ×2 IMPLANT
SUT SILK 2 0SH CR/8 30 (SUTURE) IMPLANT
SUT SILK 3 0SH CR/8 30 (SUTURE) IMPLANT
SUT VIC AB 1 CTX 36 (SUTURE) ×3
SUT VIC AB 1 CTX36XBRD ANBCTR (SUTURE) IMPLANT
SUT VIC AB 2-0 CTX 36 (SUTURE) ×2 IMPLANT
SUT VIC AB 2-0 UR6 27 (SUTURE) IMPLANT
SUT VIC AB 3-0 MH 27 (SUTURE) IMPLANT
SUT VIC AB 3-0 X1 27 (SUTURE) ×5 IMPLANT
SUT VICRYL 2 TP 1 (SUTURE) IMPLANT
SYR 20CC LL (SYRINGE) IMPLANT
SYSTEM SAHARA CHEST DRAIN ATS (WOUND CARE) ×3 IMPLANT
TAPE CLOTH 4X10 WHT NS (GAUZE/BANDAGES/DRESSINGS) ×3 IMPLANT
TAPE CLOTH SURG 4X10 WHT LF (GAUZE/BANDAGES/DRESSINGS) ×2 IMPLANT
TIP APPLICATOR SPRAY EXTEND 16 (VASCULAR PRODUCTS) IMPLANT
TOWEL GREEN STERILE (TOWEL DISPOSABLE) ×3 IMPLANT
TOWEL GREEN STERILE FF (TOWEL DISPOSABLE) ×3 IMPLANT
TRAY FOLEY MTR SLVR 16FR STAT (SET/KITS/TRAYS/PACK) ×3 IMPLANT
TROCAR XCEL BLADELESS 5X75MML (TROCAR) ×3 IMPLANT
TROCAR XCEL NON-BLD 5MMX100MML (ENDOMECHANICALS) IMPLANT
WATER STERILE IRR 1000ML POUR (IV SOLUTION) ×6 IMPLANT

## 2018-10-04 NOTE — Brief Op Note (Signed)
10/02/2018 - 10/04/2018  11:56 AM  PATIENT:  Katie Schroeder  78 y.o. female  PRE-OPERATIVE DIAGNOSIS:  LEFT PARAPNEUMONIC EFFUSION  POST-OPERATIVE DIAGNOSIS:  LEFT EARLY ORGANIZING EMPYEMA  PROCEDURE:  Procedure(s): VIDEO ASSISTED THORACOSCOPY (Left) DRAINAGE OF PLEURAL EFFUSION (Left) DECORTICATION (Left)  SURGEON:  Surgeon(s) and Role:    * Loreli Slot, MD - Primary  PHYSICIAN ASSISTANT:  Jari Favre, PA-C   ANESTHESIA:   general  EBL:  100 mL   BLOOD ADMINISTERED:none  DRAINS: TWO BLAKE DRAINS   LOCAL MEDICATIONS USED:  BUPIVICAINE   SPECIMEN:  Source of Specimen:  PLEURAL PEEL  DISPOSITION OF SPECIMEN:  PATHOLOGY  COUNTS:  YES  DICTATION: DRAGON DICTATION  PLAN OF CARE: Admit to inpatient   PATIENT DISPOSITION:  PACU - hemodynamically stable.   Delay start of Pharmacological VTE agent (>24hrs) due to surgical blood loss or risk of bleeding: yes

## 2018-10-04 NOTE — Interval H&P Note (Signed)
History and Physical Interval Note:  10/04/2018 9:17 AM  Katie Schroeder  has presented today for surgery, with the diagnosis of LEFT EFFUSION  The various methods of treatment have been discussed with the patient and family. After consideration of risks, benefits and other options for treatment, the patient has consented to  Procedure(s): VIDEO ASSISTED THORACOSCOPY (Left) DRAINAGE OF PLEURAL EFFUSION (Left) DECORTICATION (Left) as a surgical intervention .  The patient's history has been reviewed, patient examined, no change in status, stable for surgery.  I have reviewed the patient's chart and labs.  Questions were answered to the patient's satisfaction.     Loreli Slot

## 2018-10-04 NOTE — Progress Notes (Signed)
RN verified the presence of a signed informed consent that matches stated procedure by patient. Verified armband matches patient's stated name and birth date. Verified NPO status and that all jewelry, contact, glasses, dentures, and partials had been removed (if applicable).  

## 2018-10-04 NOTE — Anesthesia Preprocedure Evaluation (Addendum)
Anesthesia Evaluation  Patient identified by MRN, date of birth, ID band Patient awake    Reviewed: Allergy & Precautions, NPO status , Patient's Chart, lab work & pertinent test results  History of Anesthesia Complications Negative for: history of anesthetic complications  Airway Mallampati: III  TM Distance: <3 FB Neck ROM: Full    Dental  (+) Caps, Teeth Intact   Pulmonary shortness of breath, pneumonia,  Left PNA with empyema   left   + decreased breath sounds(-) wheezing      Cardiovascular negative cardio ROS   Rhythm:Regular     Neuro/Psych negative neurological ROS     GI/Hepatic negative GI ROS, Neg liver ROS,   Endo/Other  negative endocrine ROS  Renal/GU negative Renal ROS     Musculoskeletal negative musculoskeletal ROS (+)   Abdominal   Peds  Hematology negative hematology ROS (+)   Anesthesia Other Findings   Reproductive/Obstetrics                             Anesthesia Physical Anesthesia Plan  ASA: III  Anesthesia Plan: General   Post-op Pain Management:    Induction: Intravenous  PONV Risk Score and Plan: 3 and Ondansetron and Dexamethasone  Airway Management Planned: Double Lumen EBT  Additional Equipment: Arterial line  Intra-op Plan:   Post-operative Plan: Extubation in OR  Informed Consent: I have reviewed the patients History and Physical, chart, labs and discussed the procedure including the risks, benefits and alternatives for the proposed anesthesia with the patient or authorized representative who has indicated his/her understanding and acceptance.   Dental advisory given  Plan Discussed with: CRNA and Surgeon  Anesthesia Plan Comments:       Anesthesia Quick Evaluation

## 2018-10-04 NOTE — Progress Notes (Signed)
PROGRESS NOTE                                                                                                                                                                                                             Patient Demographics:    Katie Schroeder, is a 78 y.o. female, DOB - Aug 04, 1941, AOZ:[id number]  Admit date - 10/02/2018   Admitting Physician Lorretta Harp, MD  Outpatient Primary MD for the patient is Patient, No Pcp Per  LOS - 1  Chief Complaint  Patient presents with  . Chest Pain       Brief Narrative Katie Schroeder is a 78 y.o. female without significant past medical history, not currently taking any medications, who presents with cough, chest pain, fever and chills, he was admitted with  Acute hypoxic respiratory failure, sepsis, pneumonia and loculated left-sided pleural effusion.   Subjective:   Patient in bed, appears comfortable, denies any headache, no fever, no chest pain or pressure, improved cough & shortness of breath , no abdominal pain. No focal weakness.    Assessment  & Plan :      1.  Acute hypoxic respiratory failure, sepsis, pneumonia and loculated left-sided pleural effusion.  Highly suspicious for parapneumonic effusion versus empyema, continue empiric antibiotics, clinically shows improvement, cardiothoracic surgery was consulted and she is due for decortication surgery on 08/14/2019.  Discussed the case with cardiothoracic surgeon.  Continue supportive care with oxygen and nebulizer treatments.  2.  Pulmonary edema due to excessive IV fluids .  Diffuse Rales bilaterally.  Improved after IV Lasix, echo noted with a preserved EF of 60%.  No diastolic dysfunction.  No CHF.  3.  Hypokalemia.  Replace IV.      Family Communication  :  none  Code Status :  Full  Disposition Plan  :  Tele  Consults  :  IR, cardiothoracic surgery, pulmonary over the phone  Procedures  :    TTE -  Left ventricle: The cavity size was normal.  Systolic function was  normal. The estimated ejection fraction was in the range of 60% to 65%. Wall motion was normal; there were no regional wall motion abnormalities. Left ventricular diastolic function parameters were normal. - Pulmonary arteries: Systolic pressure was mildly increased. PA peak pressure: 37 mm Hg (S).   CT  -  Consolidation in the left lower lobe as well as loculated left pleural effusion which appears worsened when compared with the prior plain film of 10/02/2018. Somewhat  spiculated lesion in the right upper lobe measuring 2 cm in greatest dimension. Although this may be postinflammatory in nature and related to the changes on the left side, follow-up CT examination in 3 months is recommended. Aortic Atherosclerosis (ICD10-I70.0).  DVT Prophylaxis  :  Lovenox   Lab Results  Component Value Date   PLT 328 10/04/2018    Diet :  Diet Order            Diet NPO time specified Except for: Sips with Meds  Diet effective midnight               Inpatient Medications Scheduled Meds: . bupivacaine liposome  20 mL Infiltration Once  . [MAR Hold] enoxaparin (LOVENOX) injection  40 mg Subcutaneous Q24H   Continuous Infusions: . [MAR Hold] azithromycin 500 mg (10/04/18 0000)  . [MAR Hold] cefTRIAXone (ROCEPHIN)  IV 1 g (10/03/18 2212)  . [MAR Hold] potassium chloride    . vancomycin     PRN Meds:.0.9 % irrigation (POUR BTL), [MAR Hold] acetaminophen, bupivacaine, [MAR Hold] dextromethorphan-guaiFENesin, [MAR Hold] levalbuterol, [MAR Hold] ondansetron (ZOFRAN) IV, sodium chloride (PF), [MAR Hold] zolpidem  Antibiotics  :   Anti-infectives (From admission, onward)   Start     Dose/Rate Route Frequency Ordered Stop   10/04/18 0600  vancomycin (VANCOCIN) IVPB 1000 mg/200 mL premix     1,000 mg 200 mL/hr over 60 Minutes Intravenous On call to O.R. 10/03/18 2008 10/05/18 0559   10/04/18 0000  azithromycin (ZITHROMAX) 500 mg in sodium chloride 0.9 % 250 mL IVPB  Status:   Discontinued     500 mg 250 mL/hr over 60 Minutes Intravenous Every 24 hours 10/03/18 0109 10/03/18 0425   10/03/18 2200  [MAR Hold]  cefTRIAXone (ROCEPHIN) 1 g in sodium chloride 0.9 % 100 mL IVPB     (MAR Hold since Sat 10/04/2018 at 0756. Reason: Transfer to a Procedural area.)   1 g 200 mL/hr over 30 Minutes Intravenous Every 24 hours 10/03/18 0109 10/10/18 2159   10/03/18 2200  [MAR Hold]  azithromycin (ZITHROMAX) 500 mg in sodium chloride 0.9 % 250 mL IVPB     (MAR Hold since Sat 10/04/2018 at 0756. Reason: Transfer to a Procedural area.)   500 mg 250 mL/hr over 60 Minutes Intravenous Every 24 hours 10/03/18 0425 10/10/18 2159   10/03/18 0000  amoxicillin (AMOXIL) 500 MG capsule     1,000 mg Oral 3 times daily 10/03/18 0025     10/03/18 0000  azithromycin (ZITHROMAX) 250 MG tablet     250 mg Oral Daily 10/03/18 0025     10/02/18 2230  cefTRIAXone (ROCEPHIN) 2 g in sodium chloride 0.9 % 100 mL IVPB     2 g 200 mL/hr over 30 Minutes Intravenous  Once 10/02/18 2221 10/02/18 2339   10/02/18 2230  azithromycin (ZITHROMAX) 500 mg in sodium chloride 0.9 % 250 mL IVPB     500 mg 250 mL/hr over 60 Minutes Intravenous  Once 10/02/18 2221 10/03/18 0045          Objective:   Vitals:   10/03/18 1731 10/03/18 2232 10/04/18 0100 10/04/18 0502  BP: (!) 115/57 (!) 123/56  (!) 143/79  Pulse: (!) 105 (!) 104  98  Resp: 18 20  (!) 34  Temp: 99.2 F (37.3 C) 98.9 F (37.2 C)  100.1 F (37.8 C)  TempSrc: Oral Oral  Oral  SpO2: 99% 93%  96%  Weight:   66.5 kg   Height:  Wt Readings from Last 3 Encounters:  10/04/18 66.5 kg     Intake/Output Summary (Last 24 hours) at 10/04/2018 0849 Last data filed at 10/04/2018 0600 Gross per 24 hour  Intake 1490 ml  Output 1851 ml  Net -361 ml     Physical Exam  Awake Alert, Oriented X 3, No new F.N deficits, Normal affect Lynn.AT,PERRAL Supple Neck,No JVD, No cervical lymphadenopathy appriciated.  Symmetrical Chest wall movement, reduced  RLL B sounds with rales RRR,No Gallops, Rubs or new Murmurs, No Parasternal Heave +ve B.Sounds, Abd Soft, No tenderness, No organomegaly appriciated, No rebound - guarding or rigidity. No Cyanosis, Clubbing or edema, No new Rash or bruise     Data Review:    CBC Recent Labs  Lab 10/02/18 1746 10/03/18 0735 10/03/18 2018 10/04/18 0537  WBC 22.1* 18.9* 21.4* 18.7*  HGB 11.2* 9.2* 9.4* 9.2*  HCT 34.9* 28.5* 28.2* 28.1*  PLT 348 288 335 328  MCV 95.6 96.0 93.7 94.0  MCH 30.7 31.0 31.2 30.8  MCHC 32.1 32.3 33.3 32.7  RDW 14.0 14.2 14.2 14.1    Chemistries  Recent Labs  Lab 10/02/18 1746 10/03/18 0735 10/03/18 1327 10/03/18 2018 10/04/18 0537  NA 140 141  --  140 141  K 3.7 3.5  --  3.3* 3.1*  CL 105 111  --  106 107  CO2 23 21*  --  24 24  GLUCOSE 175* 115*  --  119* 124*  BUN 11 10  --  10 11  CREATININE 0.82 0.73  --  0.68 0.69  CALCIUM 9.1 7.6*  --  8.3* 8.2*  MG  --   --   --   --  2.0  AST  --   --  36 29 30  ALT  --   --  45* 41 39  ALKPHOS  --   --  128* 113 112  BILITOT  --   --  0.7 0.8 0.5   ------------------------------------------------------------------------------------------------------------------ No results for input(s): CHOL, HDL, LDLCALC, TRIG, CHOLHDL, LDLDIRECT in the last 72 hours.  No results found for: HGBA1C ------------------------------------------------------------------------------------------------------------------ No results for input(s): TSH, T4TOTAL, T3FREE, THYROIDAB in the last 72 hours.  Invalid input(s): FREET3 ------------------------------------------------------------------------------------------------------------------ No results for input(s): VITAMINB12, FOLATE, FERRITIN, TIBC, IRON, RETICCTPCT in the last 72 hours.  Coagulation profile Recent Labs  Lab 10/03/18 2018  INR 1.12    No results for input(s): DDIMER in the last 72 hours.  Cardiac Enzymes No results for input(s): CKMB, TROPONINI, MYOGLOBIN in the  last 168 hours.  Invalid input(s): CK ------------------------------------------------------------------------------------------------------------------    Component Value Date/Time   BNP 328.4 (H) 10/03/2018 1327    Micro Results Recent Results (from the past 240 hour(s))  Culture, blood (x 2)     Status: None (Preliminary result)   Collection Time: 10/03/18  1:48 AM  Result Value Ref Range Status   Specimen Description BLOOD RIGHT WRIST  Final   Special Requests   Final    BOTTLES DRAWN AEROBIC AND ANAEROBIC Blood Culture results may not be optimal due to an excessive volume of blood received in culture bottles   Culture   Final    NO GROWTH 1 DAY Performed at [facility], [address] [place], [place] [address]    Report Status PENDING  Incomplete  Culture, blood (Routine X 2) w Reflex to ID Panel     Status: None (Preliminary result)   Collection Time: 10/03/18  1:27 PM  Result Value Ref Range  Status   Specimen Description BLOOD LEFT ARM  Final   Special Requests   Final    AEROBIC BOTTLE ONLY Blood Culture results may not be optimal due to an inadequate volume of blood received in culture bottles   Culture   Final    NO GROWTH < 24 HOURS Performed at [facility], [address] [place], [place] [address]    Report Status PENDING  Incomplete  Surgical pcr screen     Status: None   Collection Time: 10/03/18  9:50 PM  Result Value Ref Range Status   MRSA, PCR NEGATIVE NEGATIVE Final   Staphylococcus aureus NEGATIVE NEGATIVE Final    Comment: (NOTE) The Xpert SA Assay (FDA approved for NASAL specimens in patients 78 years of age and older), is one component of a comprehensive surveillance program. It is not intended to diagnose infection nor to guide or monitor treatment. Performed at [facility], [address] [place], [place] [address]     Radiology Reports Dg Chest 2 View  Result Date: 10/02/2018 CLINICAL DATA:  Left-sided chest pain  EXAM: CHEST - 2 VIEW COMPARISON:  None. FINDINGS: Small moderate left pleural effusion, possibly slightly loculated along the lateral chest. Consolidation at the lingula and left lung base. Mild cardiomegaly. No pneumothorax. Degenerative changes of the spine. IMPRESSION: Small moderate left pleural effusion with airspace disease at the lingula and left lung base, possible pneumonia. Electronically Signed   By: Jasmine PangKim  Fujinaga M.D.   On: 10/02/2018 19:45   Ct Chest W Contrast  Result Date: 10/03/2018 CLINICAL DATA:  Increasing shortness of breath EXAM: CT CHEST WITH CONTRAST TECHNIQUE: Multidetector CT imaging of the chest was performed during intravenous contrast administration. CONTRAST:  75mL OMNIPAQUE IOHEXOL 300 MG/ML  SOLN COMPARISON:  Plain film from earlier in the same day. FINDINGS: Cardiovascular: Thoracic aorta demonstrates atherosclerotic calcifications without aneurysmal dilatation or dissection. Heart is at the upper limits of normal in size. Coronary calcifications are noted. No large central pulmonary embolus is seen. Peripheral branch evaluation is limited. Mediastinum/Nodes: Thoracic inlet is within normal limits. Scattered small mediastinal lymph nodes are noted likely reactive in nature. No sizable adenopathy is seen. Lungs/Pleura: Small right pleural effusion is noted. More sizable left pleural effusion is noted with areas of loculation along the chest wall particularly laterally and near the apex. Right lung is well aerated. A somewhat spiculated density is noted in the right upper lobe best seen on image number 57 of series 7. This measures approximately 2 cm in greatest dimension consolidation is noted within the left lower lobe. No sizable nodule on the left is seen. Upper Abdomen: No acute abnormality. Musculoskeletal: Degenerative changes of the thoracic spine are noted. IMPRESSION: Consolidation in the left lower lobe as well as loculated left pleural effusion which appears worsened  when compared with the prior plain film of 10/02/2018. Somewhat spiculated lesion in the right upper lobe measuring 2 cm in greatest dimension. Although this may be postinflammatory in nature and related to the changes on the left side, follow-up CT examination in 3 months is recommended. Aortic Atherosclerosis (ICD10-I70.0). Electronically Signed   By: Alcide CleverMark  Lukens M.D.   On: 10/03/2018 11:08   Dg Chest Port 1 View  Result Date: 10/03/2018 CLINICAL DATA:  Short of breath EXAM: PORTABLE CHEST 1 VIEW COMPARISON:  10/02/2018 FINDINGS: The heart remains moderately enlarged. Left pleural effusion which is likely partially loculated has increased. Opacity at the left base has also increased likely combination  of pleural fluid and consolidation. Small right pleural effusion has developed. No pneumothorax. IMPRESSION: Increasing left pleural effusion which appears partially loculated. Increasing opacity at the left base. New small right pleural effusion. Electronically Signed   By: Jolaine Click M.D.   On: 10/03/2018 09:15    Time Spent in minutes  30   Susa Raring M.D on 10/04/2018 at 8:49 AM  To page go to [email or web address] - password [facility]

## 2018-10-04 NOTE — Anesthesia Procedure Notes (Signed)
Procedure Name: Intubation Date/Time: 10/04/2018 9:18 AM Performed by: Aleida Crandell T, CRNA Pre-anesthesia Checklist: Patient identified, Emergency Drugs available, Suction available and Patient being monitored Patient Re-evaluated:Patient Re-evaluated prior to induction Oxygen Delivery Method: Circle system utilized Preoxygenation: Pre-oxygenation with 100% oxygen Induction Type: IV induction Ventilation: Mask ventilation without difficulty Laryngoscope Size: Miller and 2 Grade View: Grade III Endobronchial tube: Left, Double lumen EBT, EBT position confirmed by auscultation and EBT position confirmed by fiberoptic bronchoscope and 37 Fr Number of attempts: 1 Airway Equipment and Method: Patient positioned with wedge pillow and Stylet Placement Confirmation: ETT inserted through vocal cords under direct vision,  positive ETCO2 and breath sounds checked- equal and bilateral Secured at: 29 cm Tube secured with: Tape Dental Injury: Teeth and Oropharynx as per pre-operative assessment  Future Recommendations: Recommend- induction with short-acting agent, and alternative techniques readily available

## 2018-10-04 NOTE — Anesthesia Procedure Notes (Signed)
Arterial Line Insertion Start/End1/07/2019 8:00 AM, 10/04/2018 8:10 AM Performed by: Hodari Chuba T, Scientist, clinical (histocompatibility and immunogenetics)CRNA, CRNA  Patient location: Pre-op. Preanesthetic checklist: patient identified, IV checked, risks and benefits discussed and pre-op evaluation Lidocaine 1% used for infiltration and patient sedated Left, radial was placed Catheter size: 20 G Hand hygiene performed  and maximum sterile barriers used   Attempts: 1 Procedure performed without using ultrasound guided technique. Following insertion, dressing applied and Biopatch. Post procedure assessment: normal  Patient tolerated the procedure well with no immediate complications.

## 2018-10-04 NOTE — Transfer of Care (Signed)
Immediate Anesthesia Transfer of Care Note  Patient: Katie Schroeder  Procedure(s) Performed: VIDEO ASSISTED THORACOSCOPY (Left Chest) DRAINAGE OF PLEURAL EFFUSION (Left Chest) DECORTICATION (Left Chest)  Patient Location: PACU  Anesthesia Type:General  Level of Consciousness: awake and alert   Airway & Oxygen Therapy: Patient Spontanous Breathing and Patient connected to face mask oxygen  Post-op Assessment: Report given to RN, Post -op Vital signs reviewed and stable and Patient moving all extremities  Post vital signs: Reviewed and stable  Last Vitals:  Vitals Value Taken Time  BP 113/95 10/04/2018 11:30 AM  Temp 36.2 C 10/04/2018 11:30 AM  Pulse 88 10/04/2018 11:42 AM  Resp 24 10/04/2018 11:42 AM  SpO2 94 % 10/04/2018 11:42 AM  Vitals shown include unvalidated device data.  Last Pain:  Vitals:   10/04/18 0502  TempSrc: Oral  PainSc:          Complications: No apparent anesthesia complications

## 2018-10-05 ENCOUNTER — Inpatient Hospital Stay (HOSPITAL_COMMUNITY): Payer: Medicare Other

## 2018-10-05 LAB — BASIC METABOLIC PANEL
Anion gap: 9 (ref 5–15)
BUN: 13 mg/dL (ref 8–23)
CO2: 22 mmol/L (ref 22–32)
Calcium: 8.2 mg/dL — ABNORMAL LOW (ref 8.9–10.3)
Chloride: 107 mmol/L (ref 98–111)
Creatinine, Ser: 0.6 mg/dL (ref 0.44–1.00)
GFR calc Af Amer: >60 mL/min (ref >60)
Glucose, Bld: 119 mg/dL — ABNORMAL HIGH (ref 70–99)
Potassium: 3.6 mmol/L (ref 3.5–5.1)
Sodium: 138 mmol/L (ref 135–145)

## 2018-10-05 LAB — GLUCOSE, CAPILLARY
Glucose-Capillary: 121 mg/dL — ABNORMAL HIGH (ref 70–99)
Glucose-Capillary: 137 mg/dL — ABNORMAL HIGH (ref 70–99)
Glucose-Capillary: 125 mg/dL — ABNORMAL HIGH (ref 70–99)
Glucose-Capillary: 111 mg/dL — ABNORMAL HIGH (ref 70–99)
Glucose-Capillary: 130 mg/dL — ABNORMAL HIGH (ref 70–99)

## 2018-10-05 LAB — BLOOD GAS, ARTERIAL
Acid-Base Excess: 0.4 mmol/L (ref 0.0–2.0)
Bicarbonate: 23.9 mmol/L (ref 20.0–28.0)
O2 Content: 2 L/min
O2 SAT: 98.6 %
PATIENT TEMPERATURE: 98.6
pCO2 arterial: 34.1 mmHg (ref 32.0–48.0)
pH, Arterial: 7.459 — ABNORMAL HIGH (ref 7.350–7.450)
pO2, Arterial: 103 mmHg (ref 83.0–108.0)

## 2018-10-05 LAB — CBC
HCT: 29.1 % — ABNORMAL LOW (ref 36.0–46.0)
Hemoglobin: 9.3 g/dL — ABNORMAL LOW (ref 12.0–15.0)
MCH: 29.5 pg (ref 26.0–34.0)
MCHC: 32 g/dL (ref 30.0–36.0)
MCV: 92.4 fL (ref 80.0–100.0)
Platelets: 374 10*3/uL (ref 150–400)
RBC: 3.15 MIL/uL — ABNORMAL LOW (ref 3.87–5.11)
RDW: 14 % (ref 11.5–15.5)
WBC: 18.4 10*3/uL — AB (ref 4.0–10.5)
nRBC: 0 % (ref 0.0–0.2)

## 2018-10-05 MED ORDER — AZITHROMYCIN 500 MG PO TABS
500.0000 mg | ORAL_TABLET | Freq: Every day | ORAL | Status: DC
  Administered 2018-10-05: 500 mg via ORAL
  Filled 2018-10-05: qty 1

## 2018-10-05 MED ORDER — ENOXAPARIN SODIUM 40 MG/0.4ML ~~LOC~~ SOLN
40.0000 mg | SUBCUTANEOUS | Status: DC
  Administered 2018-10-05 – 2018-10-07 (×3): 40 mg via SUBCUTANEOUS
  Filled 2018-10-05 (×3): qty 0.4

## 2018-10-05 NOTE — Progress Notes (Signed)
PROGRESS NOTE                                                                                                                                                                                                             Patient Demographics:    Katie Schroeder, is a 78 y.o. female, DOB - 09/15/1941, WUJ:[id number]  Admit date - 10/02/2018   Admitting Physician Lorretta HarpXilin Niu, MD  Outpatient Primary MD for the patient is Patient, No Pcp Per  LOS - 2  Chief Complaint  Patient presents with  . Chest Pain       Brief Narrative Katie Schroeder is a 78 y.o. female without significant past medical history, not currently taking any medications, who presents with cough, chest pain, fever and chills, he was admitted with  Acute hypoxic respiratory failure, sepsis, pneumonia and loculated left-sided pleural effusion.   Subjective:   Patient in bed, appears comfortable, denies any headache, no fever, no chest pain or pressure, no shortness of breath , no abdominal pain. No focal weakness.    Assessment  & Plan :      1.  Acute hypoxic respiratory failure, sepsis, pneumonia and loculated left-sided pleural effusion.  Highly suspicious for parapneumonic effusion versus empyema, continue empiric antibiotics, consulted cardiothoracic surgery and patient underwent VATS procedure along with 2 chest tube placements on 10/04/2018 by cardiothoracic surgery, tolerated the procedure well, clinically seems to be stable, sepsis pathophysiology has resolved.  Continue supportive care follow cultures, chest tube management per cardiothoracic surgery.  2.  Pulmonary edema due to excessive IV fluids .  Diffuse Rales bilaterally.  Improved after IV Lasix, echo noted with a preserved EF of 60%.  No diastolic dysfunction.  No CHF.  3.  Hypokalemia.  Replace IV and currently stable.      Family Communication  :  none  Code Status :  Full  Disposition Plan  :  Tele  Consults  :  IR, cardiothoracic  surgery, pulmonary over the phone  Procedures  :    TTE -  Left ventricle: The cavity size was normal. Systolic function was  normal. The estimated ejection fraction was in the range of 60% to 65%. Wall motion was normal; there were no regional wall motion abnormalities. Left ventricular diastolic function parameters were normal. - Pulmonary arteries: Systolic pressure was mildly increased. PA peak pressure: 37 mm Hg (S).   CT  -  Consolidation in the left lower lobe as well as loculated left pleural effusion  which appears worsened when compared with the prior plain film of 10/02/2018. Somewhat spiculated lesion in the right upper lobe measuring 2 cm in greatest dimension. Although this may be postinflammatory in nature and related to the changes on the left side, follow-up CT examination in 3 months is recommended. Aortic Atherosclerosis (ICD10-I70.0).  DVT Prophylaxis  :  Lovenox   Lab Results  Component Value Date   PLT 374 10/05/2018    Diet :  Diet Order            Diet heart healthy/carb modified Room service appropriate? Yes; Fluid consistency: Thin  Diet effective now               Inpatient Medications Scheduled Meds: . acetaminophen  1,000 mg Oral Q6H   Or  . acetaminophen (TYLENOL) oral liquid 160 mg/5 mL  1,000 mg Oral Q6H  . bisacodyl  10 mg Oral Daily  . enoxaparin (LOVENOX) injection  40 mg Subcutaneous Q24H  . fentaNYL   Intravenous Q4H  . guaiFENesin  600 mg Oral BID  . insulin aspart  0-24 Units Subcutaneous Q4H  . senna-docusate  1 tablet Oral QHS   Continuous Infusions: . azithromycin 500 mg (10/04/18 2334)  . cefTRIAXone (ROCEPHIN)  IV 1 g (10/05/18 0056)  . potassium chloride     PRN Meds:.diphenhydrAMINE **OR** diphenhydrAMINE, levalbuterol, naloxone **AND** sodium chloride flush, ondansetron (ZOFRAN) IV, oxyCODONE, potassium chloride, traMADol  Antibiotics  :   Anti-infectives (From admission, onward)   Start     Dose/Rate Route Frequency  Ordered Stop   10/04/18 2100  vancomycin (VANCOCIN) IVPB 1000 mg/200 mL premix     1,000 mg 200 mL/hr over 60 Minutes Intravenous Every 12 hours 10/04/18 1347 10/04/18 2237   10/04/18 0600  vancomycin (VANCOCIN) IVPB 1000 mg/200 mL premix     1,000 mg 200 mL/hr over 60 Minutes Intravenous On call to O.R. 10/03/18 2008 10/04/18 0840   10/04/18 0000  azithromycin (ZITHROMAX) 500 mg in sodium chloride 0.9 % 250 mL IVPB  Status:  Discontinued     500 mg 250 mL/hr over 60 Minutes Intravenous Every 24 hours 10/03/18 0109 10/03/18 0425   10/03/18 2200  cefTRIAXone (ROCEPHIN) 1 g in sodium chloride 0.9 % 100 mL IVPB     1 g 200 mL/hr over 30 Minutes Intravenous Every 24 hours 10/03/18 0109 10/10/18 2159   10/03/18 2200  azithromycin (ZITHROMAX) 500 mg in sodium chloride 0.9 % 250 mL IVPB     500 mg 250 mL/hr over 60 Minutes Intravenous Every 24 hours 10/03/18 0425 10/10/18 2159   10/03/18 0000  amoxicillin (AMOXIL) 500 MG capsule     1,000 mg Oral 3 times daily 10/03/18 0025     10/03/18 0000  azithromycin (ZITHROMAX) 250 MG tablet     250 mg Oral Daily 10/03/18 0025     10/02/18 2230  cefTRIAXone (ROCEPHIN) 2 g in sodium chloride 0.9 % 100 mL IVPB     2 g 200 mL/hr over 30 Minutes Intravenous  Once 10/02/18 2221 10/02/18 2339   10/02/18 2230  azithromycin (ZITHROMAX) 500 mg in sodium chloride 0.9 % 250 mL IVPB     500 mg 250 mL/hr over 60 Minutes Intravenous  Once 10/02/18 2221 10/03/18 0045          Objective:   Vitals:   10/05/18 0400 10/05/18 0428 10/05/18 0500 10/05/18 0800  BP:  133/73  128/79  Pulse:  82 82 96  Resp: 20 (!) 31 (!) 27  16  Temp:  98 F (36.7 C)  98.4 F (36.9 C)  TempSrc:  Oral  Oral  SpO2: 97% 98% 97% 95%  Weight:      Height:        Wt Readings from Last 3 Encounters:  10/04/18 67.8 kg     Intake/Output Summary (Last 24 hours) at 10/05/2018 1014 Last data filed at 10/05/2018 0900 Gross per 24 hour  Intake 2670 ml  Output 1085 ml  Net 1585 ml      Physical Exam  Awake Alert, Oriented X 3, No new F.N deficits, Normal affect [place].AT,PERRAL Supple Neck,No JVD, No cervical lymphadenopathy appriciated.  Symmetrical Chest wall movement, reduced breath sounds in the left base with 2 chest tubes in place RRR,No Gallops, Rubs or new Murmurs, No Parasternal Heave +ve B.Sounds, Abd Soft, No tenderness, No organomegaly appriciated, No rebound - guarding or rigidity. No Cyanosis, Clubbing or edema, No new Rash or bruise    Data Review:    CBC Recent Labs  Lab 10/02/18 1746 10/03/18 0735 10/03/18 2018 10/04/18 0537 10/05/18 0513  WBC 22.1* 18.9* 21.4* 18.7* 18.4*  HGB 11.2* 9.2* 9.4* 9.2* 9.3*  HCT 34.9* 28.5* 28.2* 28.1* 29.1*  PLT 348 288 335 328 374  MCV 95.6 96.0 93.7 94.0 92.4  MCH 30.7 31.0 31.2 30.8 29.5  MCHC 32.1 32.3 33.3 32.7 32.0  RDW 14.0 14.2 14.2 14.1 14.0    Chemistries  Recent Labs  Lab 10/02/18 1746 10/03/18 0735 10/03/18 1327 10/03/18 2018 10/04/18 0537 10/05/18 0513  NA 140 141  --  140 141 138  K 3.7 3.5  --  3.3* 3.1* 3.6  CL 105 111  --  106 107 107  CO2 23 21*  --  24 24 22   GLUCOSE 175* 115*  --  119* 124* 119*  BUN 11 10  --  10 11 13   CREATININE 0.82 0.73  --  0.68 0.69 0.60  CALCIUM 9.1 7.6*  --  8.3* 8.2* 8.2*  MG  --   --   --   --  2.0  --   AST  --   --  36 29 30  --   ALT  --   --  45* 41 39  --   ALKPHOS  --   --  128* 113 112  --   BILITOT  --   --  0.7 0.8 0.5  --    ------------------------------------------------------------------------------------------------------------------ No results for input(s): CHOL, HDL, LDLCALC, TRIG, CHOLHDL, LDLDIRECT in the last 72 hours.  No results found for: HGBA1C ------------------------------------------------------------------------------------------------------------------ No results for input(s): TSH, T4TOTAL, T3FREE, THYROIDAB in the last 72 hours.  Invalid input(s):  FREET3 ------------------------------------------------------------------------------------------------------------------ No results for input(s): VITAMINB12, FOLATE, FERRITIN, TIBC, IRON, RETICCTPCT in the last 72 hours.  Coagulation profile Recent Labs  Lab 10/03/18 2018  INR 1.12    No results for input(s): DDIMER in the last 72 hours.  Cardiac Enzymes No results for input(s): CKMB, TROPONINI, MYOGLOBIN in the last 168 hours.  Invalid input(s): CK ------------------------------------------------------------------------------------------------------------------    Component Value Date/Time   BNP 328.4 (H) 10/03/2018 1327    Micro Results Recent Results (from the past 240 hour(s))  Culture, blood (x 2)     Status: None (Preliminary result)   Collection Time: 10/03/18  1:48 AM  Result Value Ref Range Status   Specimen Description BLOOD RIGHT WRIST  Final   Special Requests   Final    BOTTLES DRAWN AEROBIC AND ANAEROBIC Blood Culture results  may not be optimal due to an excessive volume of blood received in culture bottles   Culture   Final    NO GROWTH 2 DAYS Performed at [facility], [address], [place], [place] [address]    Report Status PENDING  Incomplete  Culture, blood (Routine X 2) w Reflex to ID Panel     Status: None (Preliminary result)   Collection Time: 10/03/18  1:27 PM  Result Value Ref Range Status   Specimen Description BLOOD LEFT ARM  Final   Special Requests   Final    AEROBIC BOTTLE ONLY Blood Culture results may not be optimal due to an inadequate volume of blood received in culture bottles   Culture   Final    NO GROWTH 2 DAYS Performed at [facility], [address], [place], [place] [address]    Report Status PENDING  Incomplete  Surgical pcr screen     Status: None   Collection Time: 10/03/18  9:50 PM  Result Value Ref Range Status   MRSA, PCR NEGATIVE NEGATIVE Final   Staphylococcus aureus NEGATIVE NEGATIVE Final     Comment: (NOTE) The Xpert SA Assay (FDA approved for NASAL specimens in patients 48 years of age and older), is one component of a comprehensive surveillance program. It is not intended to diagnose infection nor to guide or monitor treatment. Performed at [facility], [address], [place], [place] [address]   Aerobic/Anaerobic Culture (surgical/deep wound)     Status: None (Preliminary result)   Collection Time: 10/04/18  9:57 AM  Result Value Ref Range Status   Specimen Description FLUID LEFT PLEURAL  Final   Special Requests   Final    PATIENT ON FOLLOWING VANC Performed at [facility], [address], [place], [place] [address]    Gram Stain   Final    FEW WBC PRESENT, PREDOMINANTLY PMN NO ORGANISMS SEEN    Culture PENDING  Incomplete   Report Status PENDING  Incomplete  Aerobic/Anaerobic Culture (surgical/deep wound)     Status: None (Preliminary result)   Collection Time: 10/04/18 10:04 AM  Result Value Ref Range Status   Specimen Description TISSUE LEFT PLEURAL  Final   Special Requests   Final    PATIENT ON FOLLOWING VANC Performed at [facility], [address] [place], [place] [address]    Gram Stain   Final    MODERATE WBC PRESENT, PREDOMINANTLY PMN NO ORGANISMS SEEN    Culture PENDING  Incomplete   Report Status PENDING  Incomplete    Radiology Reports Dg Chest 2 View  Result Date: 10/02/2018 CLINICAL DATA:  Left-sided chest pain EXAM: CHEST - 2 VIEW COMPARISON:  None. FINDINGS: Small moderate left pleural effusion, possibly slightly loculated along the lateral chest. Consolidation at the lingula and left lung base. Mild cardiomegaly. No pneumothorax. Degenerative changes of the spine. IMPRESSION: Small moderate left pleural effusion with airspace disease at the lingula and left lung base, possible pneumonia. Electronically Signed   By: Jasmine Pang M.D.   On: 10/02/2018 19:45   Ct Chest W Contrast  Result Date: 10/03/2018 CLINICAL  DATA:  Increasing shortness of breath EXAM: CT CHEST WITH CONTRAST TECHNIQUE: Multidetector CT imaging of the chest was performed during intravenous contrast administration. CONTRAST:  75mL OMNIPAQUE IOHEXOL 300 MG/ML  SOLN COMPARISON:  Plain film from earlier in the same day. FINDINGS: Cardiovascular: Thoracic aorta demonstrates atherosclerotic calcifications without aneurysmal dilatation or dissection. Heart  is at the upper limits of normal in size. Coronary calcifications are noted. No large central pulmonary embolus is seen. Peripheral branch evaluation is limited. Mediastinum/Nodes: Thoracic inlet is within normal limits. Scattered small mediastinal lymph nodes are noted likely reactive in nature. No sizable adenopathy is seen. Lungs/Pleura: Small right pleural effusion is noted. More sizable left pleural effusion is noted with areas of loculation along the chest wall particularly laterally and near the apex. Right lung is well aerated. A somewhat spiculated density is noted in the right upper lobe best seen on image number 57 of series 7. This measures approximately 2 cm in greatest dimension consolidation is noted within the left lower lobe. No sizable nodule on the left is seen. Upper Abdomen: No acute abnormality. Musculoskeletal: Degenerative changes of the thoracic spine are noted. IMPRESSION: Consolidation in the left lower lobe as well as loculated left pleural effusion which appears worsened when compared with the prior plain film of 10/02/2018. Somewhat spiculated lesion in the right upper lobe measuring 2 cm in greatest dimension. Although this may be postinflammatory in nature and related to the changes on the left side, follow-up CT examination in 3 months is recommended. Aortic Atherosclerosis (ICD10-I70.0). Electronically Signed   By: Alcide Clever M.D.   On: 10/03/2018 11:08   Dg Chest Port 1 View  Result Date: 10/05/2018 CLINICAL DATA:  Patient with empyema EXAM: PORTABLE CHEST 1 VIEW  COMPARISON:  Chest radiograph 10/04/2018 FINDINGS: Monitoring leads overlie the patient. Stable cardiomegaly. Persistent small left pleural effusion. Left chest tubes remain in position. Persistent left mid lower lung patchy consolidative opacities. No pneumothorax. IMPRESSION: Stable position left chest tubes with persistent small left pleural effusion and underlying opacities. Electronically Signed   By: Annia Belt M.D.   On: 10/05/2018 07:13   Dg Chest Port 1 View  Result Date: 10/04/2018 CLINICAL DATA:  Empyema. EXAM: PORTABLE CHEST 1 VIEW COMPARISON:  Radiograph and CT scan of October 03, 2018. FINDINGS: Stable cardiomegaly. Right lung is unremarkable. Interval placement of 2 left-sided chest tubes. Loculated effusion noted on prior exam is significantly decreased currently. No definite pneumothorax is noted. Stable left basilar atelectasis or infiltrate is noted. Bony thorax is unremarkable. IMPRESSION: Interval placement of 2 left-sided chest tubes with significantly decreased loculated left pleural effusion compared to prior exam. Electronically Signed   By: Lupita Raider, M.D.   On: 10/04/2018 13:36   Dg Chest Port 1 View  Result Date: 10/03/2018 CLINICAL DATA:  Short of breath EXAM: PORTABLE CHEST 1 VIEW COMPARISON:  10/02/2018 FINDINGS: The heart remains moderately enlarged. Left pleural effusion which is likely partially loculated has increased. Opacity at the left base has also increased likely combination of pleural fluid and consolidation. Small right pleural effusion has developed. No pneumothorax. IMPRESSION: Increasing left pleural effusion which appears partially loculated. Increasing opacity at the left base. New small right pleural effusion. Electronically Signed   By: Jolaine Click M.D.   On: 10/03/2018 09:15    Time Spent in minutes  30   Susa Raring M.D on 10/05/2018 at 10:14 AM  To page go to [email or web address] - password [facility]

## 2018-10-05 NOTE — Progress Notes (Addendum)
      [address]       [place] [address]             [phone]      1 Day Post-Op Procedure(s) (LRB): VIDEO ASSISTED THORACOSCOPY (Left) DRAINAGE OF PLEURAL EFFUSION (Left) DECORTICATION (Left) Subjective: No issues overnight. She is doing well this morning. Pain is well controlled  Objective: Vital signs in last 24 hours: Temp:  [97.2 F (36.2 C)-98.1 F (36.7 C)] 98 F (36.7 C) (01/12 0428) Pulse Rate:  [72-92] 82 (01/12 0500) Cardiac Rhythm: Normal sinus rhythm (01/12 0729) Resp:  [16-35] 27 (01/12 0500) BP: (110-149)/(61-95) 133/73 (01/12 0428) SpO2:  [94 %-98 %] 97 % (01/12 0500) Arterial Line BP: (139-162)/(63-80) 155/63 (01/11 1215) Weight:  [67.8 kg] 67.8 kg (01/11 2006)     Intake/Output from previous day: 01/11 0701 - 01/12 0700 In: 2430 [P.O.:480; I.V.:1400; IV Piggyback:550] Out: 935 [Urine:725; Blood:100; Chest Tube:110] Intake/Output this shift: No intake/output data recorded.  General appearance: alert, cooperative and no distress Heart: regular rate and rhythm, S1, S2 normal, no murmur, click, rub or gallop Lungs: clear to auscultation bilaterally, diminished in the lower lobes  Abdomen: soft, non-tender; bowel sounds normal; no masses,  no organomegaly Extremities: extremities normal, atraumatic, no cyanosis or edema Wound: clean and dry  Lab Results: Recent Labs    10/04/18 0537 10/05/18 0513  WBC 18.7* 18.4*  HGB 9.2* 9.3*  HCT 28.1* 29.1*  PLT 328 374   BMET:  Recent Labs    10/04/18 0537 10/05/18 0513  NA 141 138  K 3.1* 3.6  CL 107 107  CO2 24 22  GLUCOSE 124* 119*  BUN 11 13  CREATININE 0.69 0.60  CALCIUM 8.2* 8.2*    PT/INR:  Recent Labs    10/03/18 2018  LABPROT 14.3  INR 1.12   ABG    Component Value Date/Time   PHART 7.459 (H) 10/05/2018 0530   HCO3 23.9 10/05/2018 0530   O2SAT 98.6 10/05/2018 0530   CBG (last 3)  Recent Labs    10/04/18 2325 10/05/18 0430 10/05/18 0800  GLUCAP  238* 121* 137*    Assessment/Plan: S/P Procedure(s) (LRB): VIDEO ASSISTED THORACOSCOPY (Left) DRAINAGE OF PLEURAL EFFUSION (Left) DECORTICATION (Left)  1. CV-NSR in the 60s, BP well controlled.  2. Pulm-CXR showed stable position of left chest tubes with persistent small left pleural effusion and underlying opacities. Chest tube output minimal-110cc since surgery. Tolerating 2L [place] with good oxygenation 3. Renal-creatinine 0.60, electrolytes okay 4. H and H-9.3/29.1-expected acute blood loss anemia, stable 5. Endo-good overall control. Continue SSI 6. Continue antibiotics and Zithromax. Vanco x 1 administered post-op 7. Will start lovenox tonight for DVT prophylaxis   Plan: Discontinue arterial line, discontinue IV fluids since she is now tolerating oral fluids. Will continue chest tubes for now. Continue antibiotics-follow cultures. Start lovenox tonight. Wean oxygen as tolerated. Encouraged use of incentive spirometer.    LOS: 2 days    Sharlene Dory 10/05/2018 Patient seen and examined, agree with above I think most of the opacity seen at the left base on CXR is consolidated lung. If any residual fluid- it is minimal Keep CT on suction today  Viviann Spare C. Dorris Fetch, MD [facility] [phone]

## 2018-10-06 ENCOUNTER — Inpatient Hospital Stay (HOSPITAL_COMMUNITY): Payer: Medicare Other

## 2018-10-06 ENCOUNTER — Encounter (HOSPITAL_COMMUNITY): Payer: Self-pay | Admitting: Thoracic Surgery (Cardiothoracic Vascular Surgery)

## 2018-10-06 DIAGNOSIS — Z9889 Other specified postprocedural states: Secondary | ICD-10-CM

## 2018-10-06 HISTORY — DX: Other specified postprocedural states: Z98.890

## 2018-10-06 LAB — CBC
HCT: 29.1 % — ABNORMAL LOW (ref 36.0–46.0)
Hemoglobin: 9.2 g/dL — ABNORMAL LOW (ref 12.0–15.0)
MCH: 29.3 pg (ref 26.0–34.0)
MCHC: 31.6 g/dL (ref 30.0–36.0)
MCV: 92.7 fL (ref 80.0–100.0)
PLATELETS: 419 10*3/uL — AB (ref 150–400)
RBC: 3.14 MIL/uL — ABNORMAL LOW (ref 3.87–5.11)
RDW: 14 % (ref 11.5–15.5)
WBC: 16.1 10*3/uL — ABNORMAL HIGH (ref 4.0–10.5)
nRBC: 0 % (ref 0.0–0.2)

## 2018-10-06 LAB — COMPREHENSIVE METABOLIC PANEL
ALT: 51 U/L — ABNORMAL HIGH (ref 0–44)
AST: 34 U/L (ref 15–41)
Albumin: 1.9 g/dL — ABNORMAL LOW (ref 3.5–5.0)
Alkaline Phosphatase: 84 U/L (ref 38–126)
Anion gap: 6 (ref 5–15)
BUN: 17 mg/dL (ref 8–23)
CHLORIDE: 111 mmol/L (ref 98–111)
CO2: 23 mmol/L (ref 22–32)
Calcium: 7.9 mg/dL — ABNORMAL LOW (ref 8.9–10.3)
Creatinine, Ser: 0.65 mg/dL (ref 0.44–1.00)
GFR calc Af Amer: >60 mL/min (ref >60)
Glucose, Bld: 113 mg/dL — ABNORMAL HIGH (ref 70–99)
Potassium: 3.7 mmol/L (ref 3.5–5.1)
Sodium: 140 mmol/L (ref 135–145)
Total Bilirubin: 0.6 mg/dL (ref 0.3–1.2)
Total Protein: 5 g/dL — ABNORMAL LOW (ref 6.5–8.1)

## 2018-10-06 LAB — GLUCOSE, CAPILLARY
Glucose-Capillary: 111 mg/dL — ABNORMAL HIGH (ref 70–99)
Glucose-Capillary: 116 mg/dL — ABNORMAL HIGH (ref 70–99)
Glucose-Capillary: 120 mg/dL — ABNORMAL HIGH (ref 70–99)
Glucose-Capillary: 120 mg/dL — ABNORMAL HIGH (ref 70–99)
Glucose-Capillary: 81 mg/dL (ref 70–99)
Glucose-Capillary: 86 mg/dL (ref 70–99)

## 2018-10-06 MED ORDER — AMOXICILLIN-POT CLAVULANATE 875-125 MG PO TABS
1.0000 | ORAL_TABLET | Freq: Two times a day (BID) | ORAL | Status: DC
  Administered 2018-10-06 – 2018-10-08 (×5): 1 via ORAL
  Filled 2018-10-06 (×5): qty 1

## 2018-10-06 NOTE — Progress Notes (Addendum)
2 Days Post-Op Procedure(s) (LRB): VIDEO ASSISTED THORACOSCOPY (Left) DRAINAGE OF PLEURAL EFFUSION (Left) DECORTICATION (Left) Subjective: Awake and alert.  Resting in bed.  No new complaints.  Pain control is adequate with the PCA.  No cough or shortness of breath.  Objective: Vital signs in last 24 hours: Temp:  [97.8 F (36.6 C)-98.4 F (36.9 C)] 97.9 F (36.6 C) (01/12 2300) Pulse Rate:  [66-96] 72 (01/13 0600) Cardiac Rhythm: Normal sinus rhythm (01/13 0600) Resp:  [16-44] 27 (01/13 0600) BP: (128-164)/(64-84) 164/84 (01/12 2300) SpO2:  [92 %-100 %] 100 % (01/13 0600)  Hemodynamic parameters for last 24 hours:    Intake/Output from previous day: 01/12 0701 - 01/13 0700 In: 1265 [P.O.:1040; I.V.:25; IV Piggyback:200] Out: 715 [Urine:625; Chest Tube:90] Intake/Output this shift: No intake/output data recorded.  General appearance: alert, cooperative and no distress  Heart: regular rate and rhythm, S1, S2 normal, no murmur, click, rub or gallop Lungs: clear to auscultation bilaterally anterior.  Clear serous fluid in the chest tube.  Flow record records only 20 mL of drainage over the past 12 hours.  No air leak. Abdomen: soft, non-tender; bowel sounds normal; no masses,  no organomegaly Extremities: no edema Wound: clean and dry, CT's secure  Lab Results: Recent Labs    10/05/18 0513 10/06/18 0228  WBC 18.4* 16.1*  HGB 9.3* 9.2*  HCT 29.1* 29.1*  PLT 374 419*   BMET:  Recent Labs    10/05/18 0513 10/06/18 0228  NA 138 140  K 3.6 3.7  CL 107 111  CO2 22 23  GLUCOSE 119* 113*  BUN 13 17  CREATININE 0.60 0.65  CALCIUM 8.2* 7.9*    PT/INR:  Recent Labs    10/03/18 2018  LABPROT 14.3  INR 1.12   ABG    Component Value Date/Time   PHART 7.459 (H) 10/05/2018 0530   HCO3 23.9 10/05/2018 0530   O2SAT 98.6 10/05/2018 0530   CBG (last 3)  Recent Labs    10/05/18 1706 10/05/18 2130 10/06/18 0017  GLUCAP 111* 130* 120*    Assessment/Plan: S/P  Procedure(s) (LRB): VIDEO ASSISTED THORACOSCOPY (Left) DRAINAGE OF PLEURAL EFFUSION (Left) DECORTICATION (Left)  1. CV-NSR in the 60s, BP well controlled.  2. Pulm-CXR pending. No air leak and minimal drainage. WBC trending down. Possibly remove CT's later today  Respiratory status stable, tolerating 2L [place] with good oxygenation. D/C PCA, continue oral analgesics.  3. Renal-creatinine 0.65, electrolytes okay 4. H and H stable at 9.3/29  -expected acute blood loss anemia,  5. Endo-good overall control. Continue SSI 6. Continue antibiotics ( Zithromax). Vanco x 1 administered post-op 7. DVT PPX, con't Lovenox for DVT prophylaxis  8. FEN--Toleratoing PO's with adequate intake. D/C IVF.    LOS: 3 days    Leary Roca, PA-C 10/06/2018 Patient seen and examined agree with above She has minimal serous drainage- will dc both chest tubes today Change to PO antibiotics  Viviann Spare C. Dorris Fetch, MD [facility] [phone]

## 2018-10-06 NOTE — Care Management Note (Signed)
Case Management Note  Patient Details  Name: Katie Schroeder MRN: [id number] Date of Birth: 11-Aug-1941  Subjective/Objective:   Pt admitted with CP                   Action/Plan:   PTA independent from home with husband.  Pt has PCP and denied barriers with paying for meds.  CM provided medicare.gov HH list to pt - CM will follow up with pt   Expected Discharge Date:                  Expected Discharge Plan:  Home w Home Health Services  In-House Referral:     Discharge planning Services  CM Consult  Post Acute Care Choice:    Choice offered to:     DME Arranged:    DME Agency:     HH Arranged:    HH Agency:     Status of Service:  In process, will continue to follow  If discussed at Long Length of Stay Meetings, dates discussed:    Additional Comments:  Cherylann Parr, RN 10/06/2018, 4:04 PM

## 2018-10-06 NOTE — Anesthesia Postprocedure Evaluation (Signed)
Anesthesia Post Note  Patient: HAMNAH MCGURK  Procedure(s) Performed: VIDEO ASSISTED THORACOSCOPY (Left Chest) DRAINAGE OF PLEURAL EFFUSION (Left Chest) DECORTICATION (Left Chest)     Patient location during evaluation: PACU Anesthesia Type: General Level of consciousness: awake and alert Pain management: pain level controlled Vital Signs Assessment: post-procedure vital signs reviewed and stable Respiratory status: spontaneous breathing, nonlabored ventilation, respiratory function stable and patient connected to nasal cannula oxygen Cardiovascular status: blood pressure returned to baseline and stable Postop Assessment: no apparent nausea or vomiting Anesthetic complications: no    Last Vitals:  Vitals:   10/06/18 0400 10/06/18 0600  BP:    Pulse: 66 72  Resp: 20 (!) 27  Temp:    SpO2: 100% 100%    Last Pain:  Vitals:   10/06/18 0600  TempSrc:   PainSc: 3                  Taishawn Smaldone

## 2018-10-06 NOTE — Discharge Summary (Addendum)
Physician Discharge Summary  Patient ID: Katie Schroeder MRN: [id number] DOB/AGE: 78-Sep-1942 78 y.o.  Admit date: 10/02/2018 Discharge date: 10/08/2018  Admission Diagnoses:  Patient Active Problem List   Diagnosis Date Noted  . Empyema of pleural space (HCC) 10/04/2018  . Sepsis (HCC) 10/03/2018  . Lobar pneumonia (HCC) 10/03/2018  . Acute on chronic respiratory failure with hypoxia (HCC) 10/03/2018  . Acute and chronic respiratory failure with hypoxia (HCC) 10/03/2018   Discharge Diagnoses:   Patient Active Problem List   Diagnosis Date Noted  . Left VATS with Drainage of Empyema, Decortication 10/06/2018  . Empyema of pleural space (HCC) 10/04/2018  . Sepsis (HCC) 10/03/2018  . Lobar pneumonia (HCC) 10/03/2018  . Acute on chronic respiratory failure with hypoxia (HCC) 10/03/2018  . Acute and chronic respiratory failure with hypoxia (HCC) 10/03/2018   -   2cm right upper lobe lung nodule  Discharged Condition: good  History of Present Illness:    Katie Schroeder is a 78 yo woman without significant PMH that presented without complaints of left sided pleuritic chest pain.  She was in her usual state of health until about a week and half ago when she developed a cold.  She mostly complained of symptoms of nasal congestion, malaise, and low grade fever initially.  Then approximately 2 days prior to presentation she developed left sided chest pain with cough and deep breathing.  The cough was non-productive and the pain was constant with radiation into her back.  Workup in the ED showed a possible pneumonia with a small to moderate effusion.  She had a low grade temp and a leukocytosis of 22K.  She was admitted and started on broad spectrum ABX.  Hospital Course:   During admission a CT scan of the chest showed a left lower lobe airspace disease and a complex loculated pleural effusion.  It was felt the patient would require VATs procedure with drainage of empyema and TCTS consult was  obtained.  She was evaluated by Dr. Dorris Fetch who felt the patient would require surgical drainage.  The risks and benefits of the procedure were explained to the patient and he was agreeable to proceed.  She was taken to the operating room and underwent Left VATS with drainage of empyema and decortication.  She tolerated the procedure without difficulty, was extubated and taken to the PACU in stable condition.  She did well post operatively.  Her chest tube output has remained low.  Her CXR has been free from pneumothorax.  Her chest tube was able to be removed on 10/06/2018.  Follow up CXR showed trace bilateral residual effusions and expected atelectasis in the left base.  She was transitioned to oral Augmentin prior to discharge.  OR cultures showed no growth at 4 days.  She is ambulating independently.  Her incisions are healing without evidence of infection.  She is medically stable for discharge home today.    The admission CT chest demonstrated a 2cm spiculated nodule in the right upper lobe.  Follow up CT scan in 3 months recommended.  Significant Diagnostic Studies: radiology:   CT scan: Consolidation in the left lower lobe as well as loculated left pleural effusion which appears worsened when compared with the prior plain film of 10/02/2018.  Somewhat spiculated lesion in the right upper lobe measuring 2 cm in greatest dimension. Although this may be postinflammatory in nature and related to the changes on the left side, follow-up CT examination in 3 months is recommended.  Treatments: surgery:  Left video-assisted thoracoscopy, drainage of empyema and decortication.  Discharge Exam: Blood pressure 139/62, pulse 88, temperature 98.4 F (36.9 C), temperature source Oral, resp. rate (!) 24, height 5\' 1"  (1.549 m), weight 67.8 kg, SpO2 92 %.  General appearance: alert, cooperative and no distress Heart: regular rate and rhythm Lungs: clear to auscultation bilaterally and Remains  dull in the left base. Chest tube insertion sites are dry, silk suture in place  Disposition: Discharge disposition: 01-Home or Self Care     Home  Discharge Medications:   Allergies as of 10/08/2018   No Known Allergies     Medication List    STOP taking these medications   diphenhydrAMINE 25 MG tablet Commonly known as:  SOMINEX   oxymetazoline 0.05 % nasal spray Commonly known as:  AFRIN     TAKE these medications   amoxicillin-clavulanate 875-125 MG tablet Commonly known as:  AUGMENTIN Take 1 tablet by mouth every 12 (twelve) hours.   CENTRUM ADULTS PO Take 1 tablet by mouth daily.   guaiFENesin 600 MG 12 hr tablet Commonly known as:  MUCINEX Take 600 mg by mouth 2 (two) times daily.   ibuprofen 200 MG tablet Commonly known as:  ADVIL,MOTRIN Take 400 mg by mouth every 6 (six) hours as needed for moderate pain.   Melatonin 10 MG Tabs Take 10 mg by mouth daily.   traMADol 50 MG tablet Commonly known as:  ULTRAM Take 1 tablet (50 mg total) by mouth every 6 (six) hours as needed (pain).   vitamin B-12 1000 MCG tablet Commonly known as:  CYANOCOBALAMIN Take 1,000 mcg by mouth daily.   vitamin C 500 MG tablet Commonly known as:  ASCORBIC ACID Take 500 mg by mouth 2 (two) times daily.      Follow-up Information    Loreli Slot, MD Follow up on 10/28/2018.   Specialty:  Cardiothoracic Surgery Why:  Appointment is at 2:45, please get CXR at 2:15 at [facility] located on first floor of our office building Contact information: [address] [place] [address] [phone]        [facility] Follow up on 10/15/2018.   Specialty:  Cardiothoracic Surgery Why:  Appointment is at 10:00 with nurse for suture removal Contact information: [address] [place], [address] [place] [address] [phone]          Signed: Leary Roca 10/08/2018, 8:14 AM

## 2018-10-06 NOTE — Op Note (Signed)
NAME: Katie Schroeder, Katie Schroeder MEDICAL RECORD PJ:[id number] ACCOUNT NO.:Unknown DATE OF BIRTH:1940-12-14 FACILITY: [facility] LOCATION:  PHYSICIAN:STEVEN Lars Pinks, MD  OPERATIVE REPORT  DATE OF PROCEDURE:  10/04/2018  PREOPERATIVE DIAGNOSIS:  Left parapneumonic effusion.  POSTOPERATIVE DIAGNOSIS:  Left early organizing empyema.  PROCEDURES:  Left video-assisted thoracoscopy, drainage of empyema and decortication, intercostal nerve blocks.  SURGEON:  Charlett Lango, MD  ASSISTANT:  Jari Favre, PA-C  ANESTHESIA:  General.  FINDINGS:  Multiple loculated areas of murky fluid.  No frank pus.  Fibrinous peel over majority of the lower lobe and part of upper lower lobe. Lower lobe was densely consolidated.  CLINICAL NOTE:  The patient is a 78 year old woman previously in good health who presented with pleuritic chest pain and shortness of breath.  Workup revealed left lower lobe pneumonia with a complex loculated parapneumonic effusion.  She was advised to  undergo VATS for drainage of the effusion and decortication.  The indications, risks, benefits, and alternatives were discussed in detail with the patient.  She understood and accepted the risks and agreed to proceed.  OPERATIVE NOTE:  The patient was brought to the preoperative holding area on 10/04/2018.  Anesthesia placed a central line and an arterial blood pressure monitoring line.  She was taken to the operating room, anesthetized and intubated with a double lumen  endotracheal tube.  A Foley catheter was placed.  Intravenous antibiotics were administered.  Sequential compression devices were placed on the calves for DVT prophylaxis.  She was placed in a right lateral decubitus position and the left chest was  prepped and draped in the usual sterile fashion.  Single lung ventilation of the right lung was initiated and was tolerated well throughout the procedure.  A timeout was performed.  A solution containing 20 mL of liposomal  bupivacaine, 30 mL of 0.5% bupivacaine and 50 mL of saline was used for local at the incision sites and for the intercostal nerve blocks.  An area in the seventh interspace in the mid  axillary line was injected with the local anesthetic.  A small port type incision was made.  The chest was entered bluntly using a hemostat.  A finger was inserted.  The lung was densely adherent in this area.  An area in the fifth interspace  anterolaterally then was anesthetized with the bupivacaine solution and a 5 cm working incision was made in that area.  The chest was entered.  No rib spreading was performed during the procedure.  There was some murky fluid in the pleural space.  This was sent  for both cytology and culture.  A finger was used to break down the adhesions between the port incision and the working incision.  A 5 mm port then was placed and the thoracoscope was advanced into the chest.  There were multiple loculated fluid  collections with thin fibrinous septae between these.  Fibrinous inflammatory debris was removed and the fluid was evacuated.  The lung was freed up circumferentially.  Along the diaphragm and the left costophrenic angle, there was extensive fibrinous  debris, but no frank purulence.  There was relatively minimal debridement required of the parietal pleura and it was felt that intercostal nerve blocks would be possible.  These were performed from the third to the ninth interspaces, inserting the  needle from a posterior approach and injecting the bupivacaine solution into a subpleural plane.  Eight mL was injected into each interspace.  The fissure then was entered and fibrinous exudate was removed from both  sides and then the fibrinous exudate  was decorticated from the lower lobe.  The lower lobe was densely consolidated and bright red.  The peel came off relatively easily with no significant pleural tears.  The chest was copiously irrigated with warm saline.  A  28-French Blake drain  was placed through the original port incision and directed posteriorly.  A 2nd port incision was made anterior to the first.  This area also was anesthetized with the bupivacaine solution.  A second tube was placed along the  diaphragm.  The lung was reinflated.  The working incision was closed in standard fashion.  The chest tube was placed to suction.  The patient was placed back in the supine position.    She was extubated in the operating room and taken to the Postanesthetic Care Unit in good condition.  AN/NUANCE  D:10/05/2018 T:10/05/2018 JOB:[id number]

## 2018-10-06 NOTE — Progress Notes (Signed)
Fentanyl 13 mls disposed of , witnessed by April Rn.

## 2018-10-07 ENCOUNTER — Inpatient Hospital Stay (HOSPITAL_COMMUNITY): Payer: Medicare Other

## 2018-10-07 LAB — BASIC METABOLIC PANEL
ANION GAP: 10 (ref 5–15)
BUN: 10 mg/dL (ref 8–23)
CO2: 27 mmol/L (ref 22–32)
Calcium: 8.7 mg/dL — ABNORMAL LOW (ref 8.9–10.3)
Chloride: 103 mmol/L (ref 98–111)
Creatinine, Ser: 0.67 mg/dL (ref 0.44–1.00)
GFR calc Af Amer: >60 mL/min (ref >60)
GFR calc non Af Amer: >60 mL/min (ref >60)
Glucose, Bld: 143 mg/dL — ABNORMAL HIGH (ref 70–99)
POTASSIUM: 3.6 mmol/L (ref 3.5–5.1)
Sodium: 140 mmol/L (ref 135–145)

## 2018-10-07 LAB — LEGIONELLA PNEUMOPHILA SEROGP 1 UR AG: L. pneumophila Serogp 1 Ur Ag: NEGATIVE

## 2018-10-07 LAB — CBC
HEMATOCRIT: 33.6 % — AB (ref 36.0–46.0)
Hemoglobin: 10.8 g/dL — ABNORMAL LOW (ref 12.0–15.0)
MCH: 29.3 pg (ref 26.0–34.0)
MCHC: 32.1 g/dL (ref 30.0–36.0)
MCV: 91.3 fL (ref 80.0–100.0)
NRBC: 0 % (ref 0.0–0.2)
Platelets: 540 10*3/uL — ABNORMAL HIGH (ref 150–400)
RBC: 3.68 MIL/uL — ABNORMAL LOW (ref 3.87–5.11)
RDW: 13.9 % (ref 11.5–15.5)
WBC: 18.7 10*3/uL — ABNORMAL HIGH (ref 4.0–10.5)

## 2018-10-07 LAB — GLUCOSE, CAPILLARY
Glucose-Capillary: 110 mg/dL — ABNORMAL HIGH (ref 70–99)
Glucose-Capillary: 127 mg/dL — ABNORMAL HIGH (ref 70–99)
Glucose-Capillary: 91 mg/dL (ref 70–99)
Glucose-Capillary: 139 mg/dL — ABNORMAL HIGH (ref 70–99)
Glucose-Capillary: 115 mg/dL — ABNORMAL HIGH (ref 70–99)

## 2018-10-07 NOTE — Progress Notes (Addendum)
3 Days Post-Op Procedure(s) (LRB): VIDEO ASSISTED THORACOSCOPY (Left) DRAINAGE OF PLEURAL EFFUSION (Left) DECORTICATION (Left) Subjective: Awake, alert and comfortable. Walked in the hall yesterday but does not feel she is independent with her mobility.  On RA since yesterday afternoon with no dyspnea.  Objective: Vital signs in last 24 hours: Temp:  [97.8 F (36.6 C)-98.7 F (37.1 C)] 98.7 F (37.1 C) (01/14 0722) Pulse Rate:  [84] 84 (01/13 1936) Cardiac Rhythm: Normal sinus rhythm (01/14 0722) Resp:  [20-41] 41 (01/13 1936) BP: (123-146)/(66-92) 133/92 (01/14 0722) SpO2:  [96 %-97 %] 97 % (01/13 1936)   General appearance: alert, cooperative and no distress Heart: regular rate and rhythm Lungs: diminished breath sounds LUL Wound: Left chest dressing intact and dry. No evidence of subQ air.  Lab Results: Recent Labs    10/05/18 0513 10/06/18 0228  WBC 18.4* 16.1*  HGB 9.3* 9.2*  HCT 29.1* 29.1*  PLT 374 419*   BMET:  Recent Labs    10/05/18 0513 10/06/18 0228  NA 138 140  K 3.6 3.7  CL 107 111  CO2 22 23  GLUCOSE 119* 113*  BUN 13 17  CREATININE 0.60 0.65  CALCIUM 8.2* 7.9*    PT/INR: No results for input(s): LABPROT, INR in the last 72 hours. ABG    Component Value Date/Time   PHART 7.459 (H) 10/05/2018 0530   HCO3 23.9 10/05/2018 0530   O2SAT 98.6 10/05/2018 0530   CBG (last 3)  Recent Labs    10/06/18 1940 10/06/18 2331 10/07/18 0448  GLUCAP 111* 116* 110*    Assessment/Plan: S/P Procedure(s) (LRB): VIDEO ASSISTED THORACOSCOPY (Left) DRAINAGE OF PLEURAL EFFUSION (Left) DECORTICATION (Left)  1. CV-Maintaining NSR, BP well controlled.  2. Pulm-POD3 VATS decort and drainage of para-pneumonic effusion. Improving clinically with adequate O2 sats on RA and appropriate pain control on oral Rx. CXR and lab pending. CX remain negative, IV Vanc and Rocephin replaced with oral Augmentin.  3. Renal-UO adequate. Lab pending 4. Expected acute blood  loss anemia, stable 1/13. Today's lab pending 5. Endo-good glucose control. Continue SSI 6. DVT PPX, con't Lovenox 7. FEN--Tolerating PO's with adequate intake.  8. Disposition: Not yet independent with mobility. Continue working on ambulation and transfers with PT / Charity fundraiser. Anticipate discharge to home in AM.   LOS: 4 days    Leary Roca, PA-C 10/07/2018 Patient seen and examined, agree with above intraop cultures- no growth  Viviann Spare C. Dorris Fetch, MD [facility] [phone]

## 2018-10-07 NOTE — Care Management Important Message (Signed)
Important Message  Patient Details  Name: Katie Schroeder MRN: [id number] Date of Birth: 11/09/1940   Medicare Important Message Given:  Yes    Oralia Rud Makeya Hilgert 10/07/2018, 4:37 PM

## 2018-10-08 LAB — CULTURE, BLOOD (ROUTINE X 2)
Culture: NO GROWTH
Culture: NO GROWTH

## 2018-10-08 LAB — BASIC METABOLIC PANEL
Anion gap: 6 (ref 5–15)
BUN: 12 mg/dL (ref 8–23)
CO2: 28 mmol/L (ref 22–32)
Calcium: 8.4 mg/dL — ABNORMAL LOW (ref 8.9–10.3)
Chloride: 103 mmol/L (ref 98–111)
Creatinine, Ser: 0.52 mg/dL (ref 0.44–1.00)
GFR calc Af Amer: >60 mL/min (ref >60)
GFR calc non Af Amer: >60 mL/min (ref >60)
GLUCOSE: 126 mg/dL — AB (ref 70–99)
Potassium: 4.1 mmol/L (ref 3.5–5.1)
Sodium: 137 mmol/L (ref 135–145)

## 2018-10-08 LAB — GLUCOSE, CAPILLARY
Glucose-Capillary: 112 mg/dL — ABNORMAL HIGH (ref 70–99)
Glucose-Capillary: 82 mg/dL (ref 70–99)
Glucose-Capillary: 86 mg/dL (ref 70–99)
Glucose-Capillary: 93 mg/dL (ref 70–99)

## 2018-10-08 LAB — CBC
HCT: 28.5 % — ABNORMAL LOW (ref 36.0–46.0)
Hemoglobin: 9.2 g/dL — ABNORMAL LOW (ref 12.0–15.0)
MCH: 29.8 pg (ref 26.0–34.0)
MCHC: 32.3 g/dL (ref 30.0–36.0)
MCV: 92.2 fL (ref 80.0–100.0)
Platelets: 425 10*3/uL — ABNORMAL HIGH (ref 150–400)
RBC: 3.09 MIL/uL — ABNORMAL LOW (ref 3.87–5.11)
RDW: 14 % (ref 11.5–15.5)
WBC: 14.4 10*3/uL — ABNORMAL HIGH (ref 4.0–10.5)
nRBC: 0 % (ref 0.0–0.2)

## 2018-10-08 MED ORDER — TRAMADOL HCL 50 MG PO TABS: 50.0000 mg | ORAL_TABLET | Freq: Four times a day (QID) | ORAL | 0 refills | Status: DC | PRN

## 2018-10-08 MED ORDER — AMOXICILLIN-POT CLAVULANATE 875-125 MG PO TABS: 1.0000 | ORAL_TABLET | Freq: Two times a day (BID) | ORAL | 0 refills | Status: DC

## 2018-10-08 NOTE — Progress Notes (Signed)
Removed PIV access and patient received discharge instructions. Pt understood it well. Pt took all belongings. HS [employer]

## 2018-10-08 NOTE — Plan of Care (Signed)
  Problem: Health Behavior/Discharge Planning: Goal: Ability to manage health-related needs will improve Outcome: Progressing   Problem: Clinical Measurements: Goal: Ability to maintain clinical measurements within normal limits will improve Outcome: Progressing Goal: Will remain free from infection Outcome: Progressing Goal: Diagnostic test results will improve Outcome: Progressing Goal: Respiratory complications will improve Outcome: Progressing Goal: Cardiovascular complication will be avoided Outcome: Progressing   Problem: Activity: Goal: Risk for activity intolerance will decrease Outcome: Progressing   Problem: Nutrition: Goal: Adequate nutrition will be maintained Outcome: Progressing   Problem: Coping: Goal: Level of anxiety will decrease Outcome: Progressing   Problem: Elimination: Goal: Will not experience complications related to bowel motility Outcome: Progressing Goal: Will not experience complications related to urinary retention Outcome: Progressing   Problem: Pain Managment: Goal: General experience of comfort will improve Outcome: Progressing   Problem: Safety: Goal: Ability to remain free from injury will improve Outcome: Progressing   Problem: Skin Integrity: Goal: Risk for impaired skin integrity will decrease Outcome: Progressing   Problem: Clinical Measurements: Goal: Ability to maintain clinical measurements within normal limits will improve Outcome: Progressing Goal: Postoperative complications will be avoided or minimized Outcome: Progressing   Problem: Skin Integrity: Goal: Demonstration of wound healing without infection will improve Outcome: Progressing   

## 2018-10-08 NOTE — Progress Notes (Addendum)
4 Days Post-Op Procedure(s) (LRB): VIDEO ASSISTED THORACOSCOPY (Left) DRAINAGE OF PLEURAL EFFUSION (Left) DECORTICATION (Left) Subjective: Feels good this AM. No new problems.  Walked in the hall yesterday with no difficulties.  She is independent with mobility. Denies pain of shortness of breath on RA.  Objective: Vital signs in last 24 hours: Temp:  [97.6 F (36.4 C)-98.5 F (36.9 C)] 98.4 F (36.9 C) (01/15 0731) Pulse Rate:  [88-98] 88 (01/14 2359) Cardiac Rhythm: Normal sinus rhythm (01/14 2015) Resp:  [19-36] 24 (01/15 0731) BP: (119-139)/(59-73) 139/62 (01/15 0731) SpO2:  [92 %-99 %] 92 % (01/15 0411)   Intake/Output from previous day: 01/14 0701 - 01/15 0700 In: 480 [P.O.:480] Out: 351 [Urine:350; Stool:1] Intake/Output this shift: No intake/output data recorded.  General appearance: alert, cooperative and no distress Heart: regular rate and rhythm Lungs: clear to auscultation bilaterally and Remains dull in the left base. Chest tube insertion sites are dry, silk suture in place.  Lab Results: Recent Labs    10/07/18 0854 10/08/18 0257  WBC 18.7* 14.4*  HGB 10.8* 9.2*  HCT 33.6* 28.5*  PLT 540* 425*   BMET:  Recent Labs    10/06/18 0228 10/07/18 0854  NA 140 140  K 3.7 3.6  CL 111 103  CO2 23 27  GLUCOSE 113* 143*  BUN 17 10  CREATININE 0.65 0.67  CALCIUM 7.9* 8.7*    PT/INR: No results for input(s): LABPROT, INR in the last 72 hours. ABG    Component Value Date/Time   PHART 7.459 (H) 10/05/2018 0530   HCO3 23.9 10/05/2018 0530   O2SAT 98.6 10/05/2018 0530   CBG (last 3)  Recent Labs    10/08/18 0020 10/08/18 0410 10/08/18 0733  GLUCAP 112* 93 86    Assessment/Plan: S/P Procedure(s) (LRB): VIDEO ASSISTED THORACOSCOPY (Left) DRAINAGE OF PLEURAL EFFUSION (Left) DECORTICATION (Left)   1. CV-Maintaining NSR, BP well controlled.  2. Pulm-POD4 VATS decort and drainage of para-pneumonic effusion. Improving clinically with adequate O2  sats on RA and appropriate pain control on oral Rx. CXR OK, continues to have atelectatic changes in the left base, minimal residual effusion.  CX remain negative, on day 3 oral Augmentin.WBC trending down. 3. Renal-UO adequate. Creat normal. 4. Expected acute blood loss anemia, no indication for transfusion. 5. Endo-good glucose control. Continue SSI 6.DVT PPX, con't Lovenox 7. FEN--Tolerating PO's with adequate intake.  8. Disposition:  Plan discharge to home today. Continue oral Augmentin. Follow up in the office in 1 week for repeat CBC and CXR.   LOS: 5 days    Leary Roca, PA-C 10/08/2018  [phone] Patient seen and examined, agree with above  Viviann Spare C. Dorris Fetch, MD [facility] [phone]

## 2018-10-09 LAB — AEROBIC/ANAEROBIC CULTURE W GRAM STAIN (SURGICAL/DEEP WOUND)
Culture: NO GROWTH
Culture: NO GROWTH

## 2018-10-09 LAB — AEROBIC/ANAEROBIC CULTURE (SURGICAL/DEEP WOUND)

## 2018-10-15 ENCOUNTER — Encounter (INDEPENDENT_AMBULATORY_CARE_PROVIDER_SITE_OTHER): Payer: Self-pay

## 2018-10-15 DIAGNOSIS — Z4802 Encounter for removal of sutures: Secondary | ICD-10-CM

## 2018-10-27 ENCOUNTER — Other Ambulatory Visit: Payer: Self-pay | Admitting: Thoracic Surgery (Cardiothoracic Vascular Surgery)

## 2018-10-27 DIAGNOSIS — J869 Pyothorax without fistula: Secondary | ICD-10-CM

## 2018-10-28 ENCOUNTER — Ambulatory Visit: Payer: Self-pay | Admitting: Thoracic Surgery (Cardiothoracic Vascular Surgery)

## 2018-11-03 ENCOUNTER — Other Ambulatory Visit: Payer: Self-pay | Admitting: *Deleted

## 2018-11-03 ENCOUNTER — Encounter: Payer: Self-pay | Admitting: Thoracic Surgery (Cardiothoracic Vascular Surgery)

## 2018-11-03 ENCOUNTER — Ambulatory Visit (INDEPENDENT_AMBULATORY_CARE_PROVIDER_SITE_OTHER): Payer: Self-pay | Admitting: Thoracic Surgery (Cardiothoracic Vascular Surgery)

## 2018-11-03 ENCOUNTER — Other Ambulatory Visit: Payer: Self-pay

## 2018-11-03 ENCOUNTER — Ambulatory Visit
Admission: RE | Admit: 2018-11-03 | Discharge: 2018-11-03 | Disposition: A | Discharge status: Home / Self Care | Payer: Medicare Other | Source: Ambulatory Visit | Attending: Thoracic Surgery (Cardiothoracic Vascular Surgery) | Admitting: Thoracic Surgery (Cardiothoracic Vascular Surgery)

## 2018-11-03 VITALS — BP 98/78 | HR 84 | Resp 18 | Ht 60.0 in | Wt 136.8 lb

## 2018-11-03 DIAGNOSIS — J869 Pyothorax without fistula: Secondary | ICD-10-CM

## 2018-11-03 DIAGNOSIS — R918 Other nonspecific abnormal finding of lung field: Secondary | ICD-10-CM

## 2018-11-03 DIAGNOSIS — Z9889 Other specified postprocedural states: Secondary | ICD-10-CM

## 2018-11-03 NOTE — Progress Notes (Signed)
[address]       Katie Schroeder [id number]             [phone]       HPI: Katie Schroeder returns for a scheduled follow-up visit  Katie Schroeder is a 78 year old non-smoker with no significant past medical history.  She presented in January with left-sided pleuritic chest pain.  Work-up revealed pneumonia with a complex loculated pleural effusion.  She was treated with intravenous antibiotics and underwent a left VATS for drainage of an early organizing empyema and decortication on 10/04/2018.  Her postoperative course was uncomplicated and she went home on day 4.  She is feeling well currently.  She has not had to take narcotics for about 2 weeks now.  She is not having any shortness of breath.  She feels that over the past 3 or 4 days she started to get more energy and feel less fatigued.   Current Outpatient Medications  Medication Sig Dispense Refill  . ibuprofen (ADVIL,MOTRIN) 200 MG tablet Take 400 mg by mouth every 6 (six) hours as needed for moderate pain.    . Melatonin 10 MG TABS Take 10 mg by mouth daily.    . Multiple Vitamins-Minerals (CENTRUM ADULTS PO) Take 1 tablet by mouth daily.    . vitamin B-12 (CYANOCOBALAMIN) 1000 MCG tablet Take 1,000 mcg by mouth daily.     No current facility-administered medications for this visit.     Physical Exam BP 98/78 (BP Location: Right Arm, Patient Position: Sitting, Cuff Size: Normal)   Pulse 84   Resp 18   Ht 5' (1.524 m)   Wt 136 lb 12.8 oz (62.1 kg)   SpO2 98% Comment: RA  BMI 26.2 kg/m  Katie Schroeder in no acute distress Alert and oriented x3 no focal deficits Lungs clear with equal breath sounds bilaterally Cardiac regular rate and rhythm normal S1 and S2 Incisions well-healed No peripheral edema  Diagnostic Tests: CHEST - 2 VIEW  COMPARISON:  PA and lateral chest 10/07/2018.  FINDINGS: Small right pleural effusion seen on the prior examination has resolved. Small left pleural effusion  and basilar atelectasis have improved. Mild blunting of left costophrenic angle could be due to residual effusion or scar. Small focus of linear atelectasis or scar in the lingula is identified. Aeration in the left base has improved. The right lung is clear. No pneumothorax. Heart size is normal. No acute or focal bony abnormality.  IMPRESSION: Small right pleural effusion seen on the prior examination has resolved.  Decreased small left pleural effusion with mild residual scar or trace effusion noted. Aeration in the left lung base has improved.  No new abnormality since the prior exam.   Electronically Signed   By: Drusilla Kanner M.D.   On: 11/03/2018 16:21 I personally reviewed the chest x-ray and concur with the findings noted above  Impression: Katie Schroeder is a 78 year old Schroeder who presented with left-sided pleuritic chest pain.  She was diagnosed with pneumonia and had a complex parapneumonic effusion.  She underwent a left VATS for drainage of an early organizing empyema and decortication on 10/04/2018.  Her postoperative course was unremarkable and she went home on day 4.  She is doing extremely well at this point time.  She is not taking any pain medication.  She has not had any respiratory issues or fevers or chills or cough.  She is anxious to resume normal activities.  She may drive.  She can  resume activities but was advised to build into new activities gradually.  On her CT there was a spiculated right upper lobe lung nodule.  This was felt to likely be infectious in etiology, but it was recommended that she have a follow-up CT to ensure that it resolves.  I will plan to see her back in a month to check on that.  Plan:  Return in 1 month with CT chest to follow-up right lung nodule seen on CT in January  Loreli SlotSteven C Nkenge Sonntag, MD [facility] [phone]

## 2018-11-28 ENCOUNTER — Other Ambulatory Visit: Payer: Medicare Other

## 2018-12-02 ENCOUNTER — Ambulatory Visit: Payer: Self-pay | Admitting: Thoracic Surgery (Cardiothoracic Vascular Surgery)

## 2019-06-25 IMAGING — DX DG CHEST 1V PORT
1 series · 1 of 1 positions shown · non-contrast
Comparison: Single-view of the chest earlier today.

CLINICAL DATA: Status post left chest tube removal today.

EXAM:
PORTABLE CHEST 1 VIEW

[chest ap]
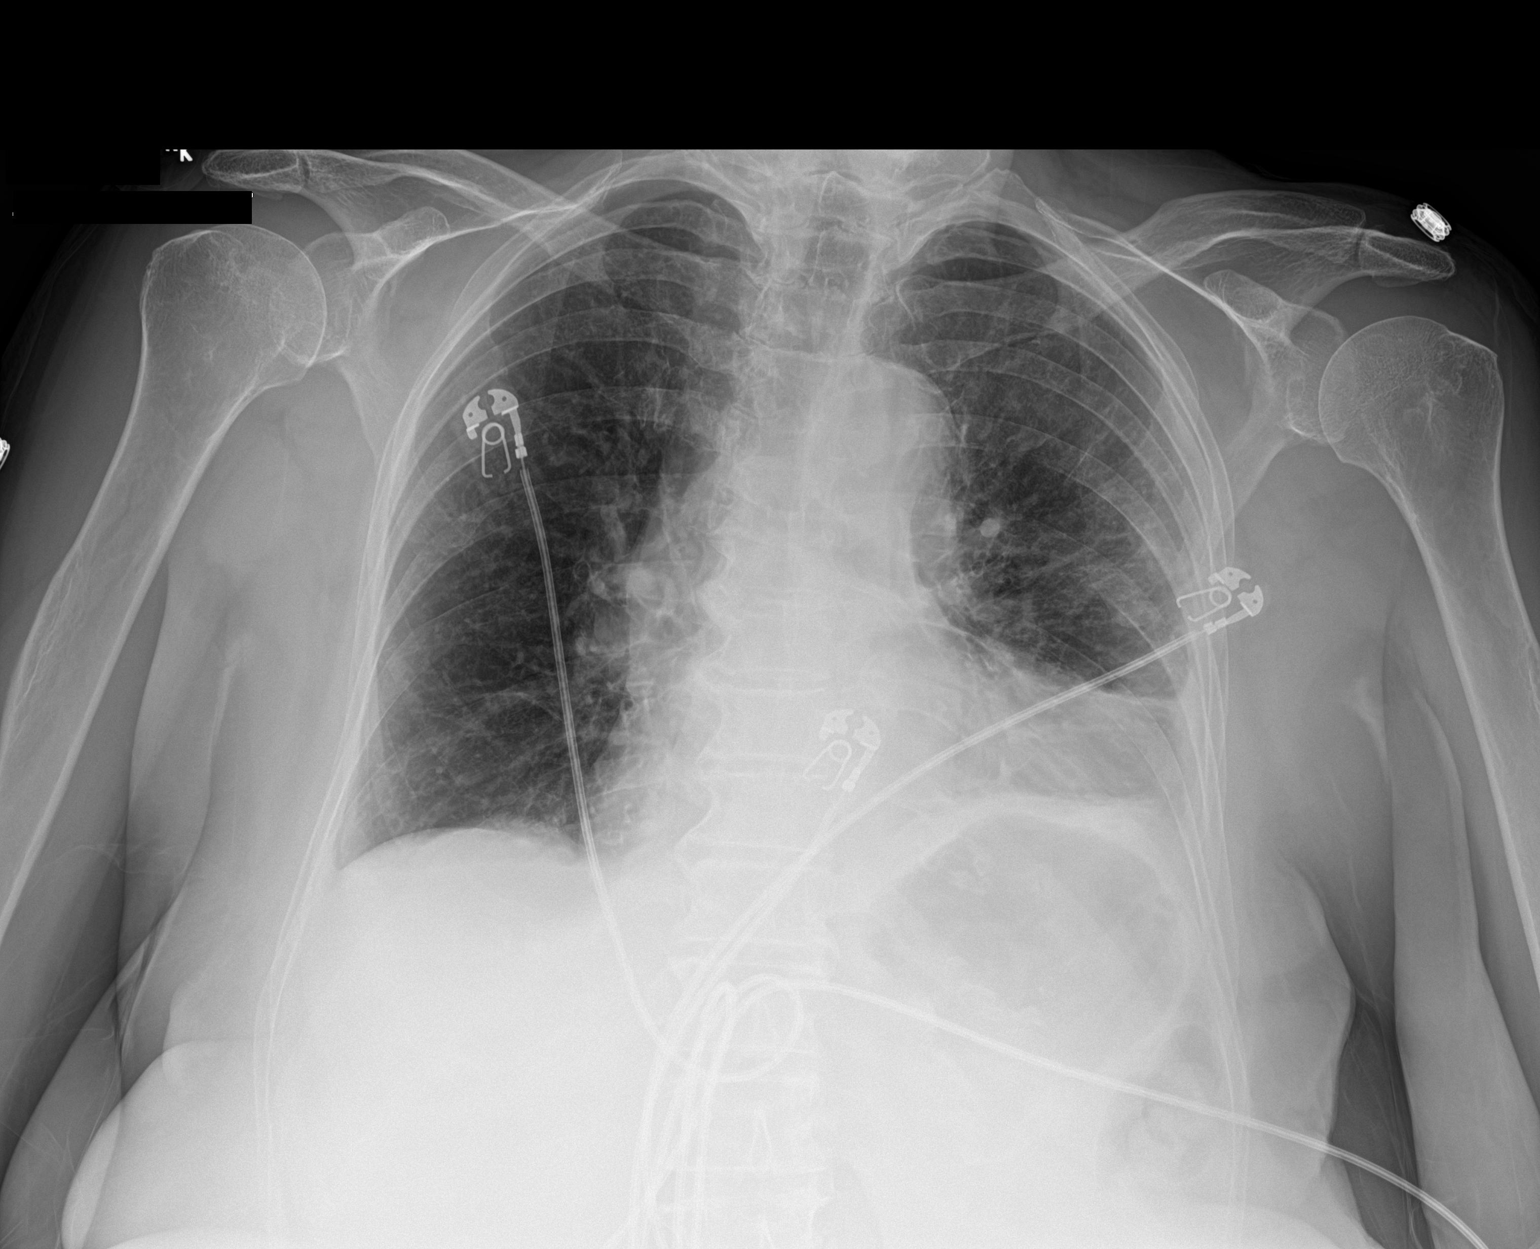

[1 of 1 positions shown; findings below may reference images not displayed]

FINDINGS: Left chest tube has been removed. No pneumothorax is identified.
Left basilar atelectasis is noted. Right lung is clear. Heart size
is upper normal. Aortic atherosclerosis is seen. No acute or focal
bony abnormality.
IMPRESSION: Negative for pneumothorax after left chest tube removal.

No change in mild left basilar atelectasis.

## 2019-11-15 ENCOUNTER — Ambulatory Visit: Payer: Medicare Other | Attending: Internal Medicine

## 2019-11-15 DIAGNOSIS — Z23 Encounter for immunization: Secondary | ICD-10-CM | POA: Insufficient documentation

## 2019-11-15 NOTE — Progress Notes (Signed)
   Covid-19 Vaccination Clinic  Name:  Katie Schroeder    MRN: [id number] DOB: 1941/03/11  11/15/2019  Katie Schroeder was observed post Covid-19 immunization for 15 minutes without incidence. She was provided with Vaccine Information Sheet and instruction to access the V-Safe system.   Katie Schroeder was instructed to call 911 with any severe reactions post vaccine: Marland Kitchen Difficulty breathing  . Swelling of your face and throat  . A fast heartbeat  . A bad rash all over your body  . Dizziness and weakness    Immunizations Administered    Name Date Dose VIS Date Route   Pfizer COVID-19 Vaccine 11/15/2019 11:03 AM 0.3 mL 09/04/2019 Intramuscular   Manufacturer: [employer], [employer]   Lot: [id number]   NDC: [address]-7

## 2019-12-09 ENCOUNTER — Ambulatory Visit: Payer: Medicare Other | Attending: Internal Medicine

## 2019-12-09 DIAGNOSIS — Z23 Encounter for immunization: Secondary | ICD-10-CM

## 2019-12-09 NOTE — Progress Notes (Signed)
   Covid-19 Vaccination Clinic  Name:  Katie Schroeder    MRN: [id number] DOB: 1941-01-22  12/09/2019  [name] was observed post Covid-19 immunization for 30 minutes based on pre-vaccination screening without incident. She was provided with Vaccine Information Sheet and instruction to access the V-Safe system.   [name] was instructed to call 911 with any severe reactions post vaccine: Marland Kitchen Difficulty breathing  . Swelling of face and throat  . A fast heartbeat  . A bad rash all over body  . Dizziness and weakness   Immunizations Administered    Name Date Dose VIS Date Route   Pfizer COVID-19 Vaccine 12/09/2019  1:54 PM 0.3 mL 09/04/2019 Intramuscular   Manufacturer: [employer], [employer]   Lot: [id number]   NDC: [address]-4

## 2020-07-09 ENCOUNTER — Ambulatory Visit: Payer: Medicare Other | Attending: Internal Medicine

## 2020-07-09 DIAGNOSIS — Z23 Encounter for immunization: Secondary | ICD-10-CM

## 2020-07-09 NOTE — Progress Notes (Signed)
   [facility]  Name:  MARICE GUIDONE    MRN: [id number] DOB: 1941/04/29  07/09/2020  Ms. Kief was observed post Covid-19 immunization for 15 minutes without incident. She was provided with Vaccine Information Sheet and instruction to access the V-Safe system.   Ms. Demchak was instructed to call 911 with any severe reactions post vaccine: Marland Kitchen Difficulty breathing  . Swelling of face and throat  . A fast heartbeat  . A bad rash all over body  . Dizziness and weakness
